# Patient Record
Sex: Female | Born: 1960
Health system: Southern US, Community
[De-identification: ages and names within clinical notes are randomized; demographics above are authoritative.]

## PROBLEM LIST (undated history)

## (undated) DIAGNOSIS — H579 Unspecified disorder of eye and adnexa: Secondary | ICD-10-CM

## (undated) DIAGNOSIS — I10 Essential (primary) hypertension: Secondary | ICD-10-CM

## (undated) DIAGNOSIS — G4730 Sleep apnea, unspecified: Secondary | ICD-10-CM

## (undated) DIAGNOSIS — M810 Age-related osteoporosis without current pathological fracture: Secondary | ICD-10-CM

## (undated) DIAGNOSIS — E785 Hyperlipidemia, unspecified: Secondary | ICD-10-CM

## (undated) DIAGNOSIS — T7840XA Allergy, unspecified, initial encounter: Secondary | ICD-10-CM

## (undated) HISTORY — DX: Unspecified disorder of eye and adnexa: H57.9

## (undated) HISTORY — PX: AUGMENTATION MAMMAPLASTY: SUR837

## (undated) HISTORY — DX: Essential (primary) hypertension: I10

## (undated) HISTORY — DX: Sleep apnea, unspecified: G47.30

## (undated) HISTORY — DX: Allergy, unspecified, initial encounter: T78.40XA

## (undated) HISTORY — PX: TUBAL LIGATION: SHX77

## (undated) HISTORY — PX: BREAST SURGERY: SHX581

## (undated) HISTORY — DX: Age-related osteoporosis without current pathological fracture: M81.0

## (undated) HISTORY — PX: COLONOSCOPY: SHX174

## (undated) HISTORY — DX: Hyperlipidemia, unspecified: E78.5

---

## 1997-12-19 ENCOUNTER — Ambulatory Visit (HOSPITAL_COMMUNITY): Admission: RE | Admit: 1997-12-19 | Discharge: 1997-12-19 | Payer: Self-pay | Admitting: Endocrinology

## 2012-06-11 ENCOUNTER — Encounter: Payer: Self-pay | Admitting: Family Medicine

## 2012-06-25 ENCOUNTER — Ambulatory Visit (INDEPENDENT_AMBULATORY_CARE_PROVIDER_SITE_OTHER): Payer: BC Managed Care – PPO | Admitting: Family Medicine

## 2012-06-25 ENCOUNTER — Encounter: Payer: Self-pay | Admitting: Family Medicine

## 2012-06-25 VITALS — BP 123/85 | HR 79 | Temp 98.4°F | Resp 16 | Ht 62.0 in | Wt 139.0 lb

## 2012-06-25 DIAGNOSIS — B9689 Other specified bacterial agents as the cause of diseases classified elsewhere: Secondary | ICD-10-CM

## 2012-06-25 DIAGNOSIS — B373 Candidiasis of vulva and vagina: Secondary | ICD-10-CM

## 2012-06-25 DIAGNOSIS — E785 Hyperlipidemia, unspecified: Secondary | ICD-10-CM

## 2012-06-25 DIAGNOSIS — Z0000 Encounter for general adult medical examination without abnormal findings: Secondary | ICD-10-CM

## 2012-06-25 DIAGNOSIS — Z1231 Encounter for screening mammogram for malignant neoplasm of breast: Secondary | ICD-10-CM

## 2012-06-25 DIAGNOSIS — Z23 Encounter for immunization: Secondary | ICD-10-CM

## 2012-06-25 LAB — POCT WET PREP WITH KOH
KOH Prep POC: POSITIVE
Trichomonas, UA: NEGATIVE
Yeast Wet Prep HPF POC: NEGATIVE

## 2012-06-25 LAB — LIPID PANEL
Cholesterol: 327 mg/dL — ABNORMAL HIGH (ref 0–200)
HDL: 44 mg/dL (ref >39)
Total CHOL/HDL Ratio: 7.4 Ratio
VLDL: 34 mg/dL (ref 0–40)

## 2012-06-25 MED ORDER — ACETAMINOPHEN 325 MG PO TABS: 650.0000 mg | ORAL_TABLET | Freq: Four times a day (QID) | ORAL | Status: DC | PRN

## 2012-06-25 MED ORDER — IBUPROFEN 200 MG PO TABS: 200.0000 mg | ORAL_TABLET | Freq: Four times a day (QID) | ORAL | Status: DC | PRN

## 2012-06-25 MED ORDER — METRONIDAZOLE 0.75 % VA GEL: 1.0000 | Freq: Two times a day (BID) | VAGINAL | Status: AC

## 2012-06-25 MED ORDER — FLUCONAZOLE 150 MG PO TABS: 150.0000 mg | ORAL_TABLET | Freq: Once | ORAL | Status: DC

## 2012-06-26 ENCOUNTER — Other Ambulatory Visit: Payer: Self-pay | Admitting: Family Medicine

## 2012-06-26 DIAGNOSIS — E785 Hyperlipidemia, unspecified: Secondary | ICD-10-CM | POA: Insufficient documentation

## 2012-06-26 MED ORDER — PRAVASTATIN SODIUM 40 MG PO TABS: 80.0000 mg | ORAL_TABLET | Freq: Every day | ORAL | Status: DC

## 2012-06-28 ENCOUNTER — Encounter: Payer: Self-pay | Admitting: Gastroenterology

## 2012-06-29 LAB — PAP IG, CT-NG, RFX HPV ASCU: GC Probe Amp: NEGATIVE

## 2012-08-11 ENCOUNTER — Encounter: Payer: Self-pay | Admitting: Gastroenterology

## 2012-08-13 ENCOUNTER — Encounter: Payer: Self-pay | Admitting: Gastroenterology

## 2012-08-20 ENCOUNTER — Ambulatory Visit: Payer: Self-pay

## 2012-10-29 ENCOUNTER — Ambulatory Visit: Payer: BC Managed Care – PPO | Admitting: Family Medicine

## 2012-11-12 ENCOUNTER — Ambulatory Visit: Payer: BC Managed Care – PPO | Admitting: Family Medicine

## 2012-11-12 NOTE — Progress Notes (Signed)
Subjective:    Patient ID: Sandra Ball, female    DOB: [birth year], 52 y.o.   MRN: [id number] Chief Complaint  Patient presents with  . Annual Exam  . Gynecologic Exam    HPI  Here for her routine exam today. Language barrier so daughter helps interpret.  No past medical history on file. Past Surgical History  Procedure Laterality Date  . Breast surgery    . Tubal ligation     No current outpatient prescriptions on file prior to visit.   No current facility-administered medications on file prior to visit.   Allergies  Allergen Reactions  . Aleve (Naproxen Sodium)    Family History  Problem Relation Age of Onset  . Hypertension Father   . Hypertension Mother    History   Social History  . Marital Status: Single    Spouse Name: N/A    Number of Children: N/A  . Years of Education: N/A   Social History Main Topics  . Smoking status: Never Smoker   . Smokeless tobacco: Not on file  . Alcohol Use: Not on file  . Drug Use: Not on file  . Sexually Active: Not on file   Other Topics Concern  . Not on file   Social History Narrative  . No narrative on file       Review of Systems  All other systems reviewed and are negative.      BP 123/85  Pulse 79  Temp(Src) 98.4 F (36.9 C)  Resp 16  Ht 5\' 2"  (1.575 m)  Wt 139 lb (63.05 kg)  BMI 25.42 kg/m2 Objective:   Physical Exam  Constitutional: She is oriented to person, place, and time. She appears well-developed and well-nourished. No distress.  HENT:  Head: Normocephalic and atraumatic.  Right Ear: Tympanic membrane, external ear and ear canal normal.  Left Ear: Tympanic membrane, external ear and ear canal normal.  Nose: No mucosal edema or rhinorrhea.  Mouth/Throat: Uvula is midline, oropharynx is clear and moist and mucous membranes are normal. No posterior oropharyngeal erythema.  Eyes: Conjunctivae and EOM are normal. Pupils are equal, round, and reactive to light. Right eye exhibits no discharge. Left  eye exhibits no discharge. No scleral icterus.  Neck: Normal range of motion. Neck supple. No thyromegaly present.  Cardiovascular: Normal rate, regular rhythm, normal heart sounds and intact distal pulses.   Pulmonary/Chest: Effort normal and breath sounds normal. No respiratory distress.  Abdominal: Soft. Bowel sounds are normal. There is no tenderness.  Genitourinary: No breast swelling, tenderness, discharge or bleeding. Uterus is tender. Cervix exhibits no motion tenderness and no friability. Right adnexum displays no mass and no tenderness. Left adnexum displays no mass and no tenderness. Vaginal discharge found.  Musculoskeletal: She exhibits no edema.  Lymphadenopathy:    She has no cervical adenopathy.  Neurological: She is alert and oriented to person, place, and time. She has normal reflexes.  Skin: Skin is warm and dry. She is not diaphoretic. No erythema.  Psychiatric: She has a normal mood and affect. Her behavior is normal.          Results for orders placed in visit on 06/25/12  LIPID PANEL      Result Value Range   Cholesterol 327 (*) 0 - 200 mg/dL   Triglycerides 409 (*) <150 mg/dL   HDL 44  >81 mg/dL   Total CHOL/HDL Ratio 7.4     VLDL 34  0 - 40 mg/dL   LDL Cholesterol 191 (*)  0 - 99 mg/dL  POCT WET PREP WITH KOH      Result Value Range   Trichomonas, UA Negative     Clue Cells Wet Prep HPF POC 5-12     Epithelial Wet Prep HPF POC 8-tntc     Yeast Wet Prep HPF POC neg     Bacteria Wet Prep HPF POC 1+     RBC Wet Prep HPF POC 0-3     WBC Wet Prep HPF POC 2-24     KOH Prep POC Positive    PAP IG, CT-NG, RFX HPV ASCU      Result Value Range   Specimen adequacy:       FINAL DIAGNOSIS:       Cytotechnologist:       Chlamydia Probe Amp NEGATIVE     GC Probe Amp NEGATIVE      Assessment & Plan:  Need for influenza vaccination - Plan: Flu vaccine greater than or equal to 3yo preservative free IM  Well adult exam - Plan: Ambulatory referral to  Gastroenterology, Pap IG, CT/NG w/ reflex HPV when ASC-U, POCT Wet Prep with KOH  Hyperlipidemia - Plan: Lipid Panel  Bacterial vaginitis - Plan: metroNIDAZOLE (METROGEL VAGINAL) 0.75 % vaginal gel  Yeast vaginitis - Plan: fluconazole (DIFLUCAN) 150 MG tablet  Other screening mammogram - Plan: MM Digital Screening  Meds ordered this encounter  Medications  . fish oil-omega-3 fatty acids 1000 MG capsule    Sig: Take 2 g by mouth daily.  . cyanocobalamin 100 MCG tablet    Sig: Take 100 mcg by mouth daily.  . Multiple Vitamin (MULTIVITAMIN) tablet    Sig: Take 1 tablet by mouth daily.  Marland Kitchen acetaminophen (TYLENOL) 325 MG tablet    Sig: Take 2 tablets (650 mg total) by mouth every 6 (six) hours as needed for pain.    Dispense:  120 tablet    Refill:  11  . ibuprofen (ADVIL,MOTRIN) 200 MG tablet    Sig: Take 1 tablet (200 mg total) by mouth every 6 (six) hours as needed for pain.    Dispense:  120 tablet    Refill:  11  . metroNIDAZOLE (METROGEL VAGINAL) 0.75 % vaginal gel    Sig: Place 1 Applicatorful vaginally 2 (two) times daily.    Dispense:  70 g    Refill:  0  . fluconazole (DIFLUCAN) 150 MG tablet    Sig: Take 1 tablet (150 mg total) by mouth once. Repeat if needed    Dispense:  2 tablet    Refill:  0

## 2012-12-10 ENCOUNTER — Ambulatory Visit: Payer: BC Managed Care – PPO | Admitting: Family Medicine

## 2013-06-17 ENCOUNTER — Other Ambulatory Visit (HOSPITAL_COMMUNITY)
Admission: RE | Admit: 2013-06-17 | Discharge: 2013-06-17 | Disposition: A | Discharge status: Home / Self Care | Payer: BC Managed Care – PPO | Source: Ambulatory Visit | Attending: Family Medicine | Admitting: Family Medicine

## 2013-06-17 ENCOUNTER — Other Ambulatory Visit: Payer: Self-pay | Admitting: Family Medicine

## 2013-06-17 DIAGNOSIS — Z01419 Encounter for gynecological examination (general) (routine) without abnormal findings: Secondary | ICD-10-CM | POA: Insufficient documentation

## 2013-10-30 ENCOUNTER — Encounter: Payer: Self-pay | Admitting: Family Medicine

## 2014-07-07 ENCOUNTER — Ambulatory Visit (INDEPENDENT_AMBULATORY_CARE_PROVIDER_SITE_OTHER): Payer: BC Managed Care – PPO | Admitting: Family Medicine

## 2014-07-07 VITALS — BP 128/84 | HR 71 | Temp 98.5°F | Resp 16 | Ht 62.5 in | Wt 137.0 lb

## 2014-07-07 DIAGNOSIS — Z23 Encounter for immunization: Secondary | ICD-10-CM

## 2014-07-07 DIAGNOSIS — Z0000 Encounter for general adult medical examination without abnormal findings: Secondary | ICD-10-CM

## 2014-07-07 NOTE — Patient Instructions (Signed)
Tdap Vaccine (Tetanus, Diphtheria, Pertussis): What You Need to Know 1. Why get vaccinated? Tetanus, diphtheria and pertussis can be very serious diseases, even for adolescents and adults. Tdap vaccine can protect us from these diseases. TETANUS (Lockjaw) causes painful muscle tightening and stiffness, usually all over the body.  It can lead to tightening of muscles in the head and neck so you can't open your mouth, swallow, or sometimes even breathe. Tetanus kills about 1 out of 5 people who are infected. DIPHTHERIA can cause a thick coating to form in the back of the throat.  It can lead to breathing problems, paralysis, heart failure, and death. PERTUSSIS (Whooping Cough) causes severe coughing spells, which can cause difficulty breathing, vomiting and disturbed sleep.  It can also lead to weight loss, incontinence, and rib fractures. Up to 2 in 100 adolescents and 5 in 100 adults with pertussis are hospitalized or have complications, which could include pneumonia or death. These diseases are caused by bacteria. Diphtheria and pertussis are spread from person to person through coughing or sneezing. Tetanus enters the body through cuts, scratches, or wounds. Before vaccines, the [place] saw as many as 200,000 cases a year of diphtheria and pertussis, and hundreds of cases of tetanus. Since vaccination began, tetanus and diphtheria have dropped by about 99% and pertussis by about 80%. 2. Tdap vaccine Tdap vaccine can protect adolescents and adults from tetanus, diphtheria, and pertussis. One dose of Tdap is routinely given at age 11 or 12. People who did not get Tdap at that age should get it as soon as possible. Tdap is especially important for health care professionals and anyone having close contact with a baby younger than 12 months. Pregnant women should get a dose of Tdap during every pregnancy, to protect the newborn from pertussis. Infants are most at risk for severe, life-threatening  complications from pertussis. A similar vaccine, called Td, protects from tetanus and diphtheria, but not pertussis. A Td booster should be given every 10 years. Tdap may be given as one of these boosters if you have not already gotten a dose. Tdap may also be given after a severe cut or burn to prevent tetanus infection. Your doctor can give you more information. Tdap may safely be given at the same time as other vaccines. 3. Some people should not get this vaccine  If you ever had a life-threatening allergic reaction after a dose of any tetanus, diphtheria, or pertussis containing vaccine, OR if you have a severe allergy to any part of this vaccine, you should not get Tdap. Tell your doctor if you have any severe allergies.  If you had a coma, or long or multiple seizures within 7 days after a childhood dose of DTP or DTaP, you should not get Tdap, unless a cause other than the vaccine was found. You can still get Td.  Talk to your doctor if you:  have epilepsy or another nervous system problem,  had severe pain or swelling after any vaccine containing diphtheria, tetanus or pertussis,  ever had Guillain-Barr Syndrome (GBS),  aren't feeling well on the day the shot is scheduled. 4. Risks of a vaccine reaction With any medicine, including vaccines, there is a chance of side effects. These are usually mild and go away on their own, but serious reactions are also possible. Brief fainting spells can follow a vaccination, leading to injuries from falling. Sitting or lying down for about 15 minutes can help prevent these. Tell your doctor if you feel   dizzy or light-headed, or have vision changes or ringing in the ears. Mild problems following Tdap (Did not interfere with activities)  Pain where the shot was given (about 3 in 4 adolescents or 2 in 3 adults)  Redness or swelling where the shot was given (about 1 person in 5)  Mild fever of at least 100.22F (up to about 1 in 25 adolescents or  1 in 100 adults)  Headache (about 3 or 4 people in 10)  Tiredness (about 1 person in 3 or 4)  Nausea, vomiting, diarrhea, stomach ache (up to 1 in 4 adolescents or 1 in 10 adults)  Chills, body aches, sore joints, rash, swollen glands (uncommon) Moderate problems following Tdap (Interfered with activities, but did not require medical attention)  Pain where the shot was given (about 1 in 5 adolescents or 1 in 100 adults)  Redness or swelling where the shot was given (up to about 1 in 16 adolescents or 1 in 25 adults)  Fever over 102F (about 1 in 100 adolescents or 1 in 250 adults)  Headache (about 3 in 20 adolescents or 1 in 10 adults)  Nausea, vomiting, diarrhea, stomach ache (up to 1 or 3 people in 100)  Swelling of the entire arm where the shot was given (up to about 3 in 100). Severe problems following Tdap (Unable to perform usual activities; required medical attention)  Swelling, severe pain, bleeding and redness in the arm where the shot was given (rare). A severe allergic reaction could occur after any vaccine (estimated less than 1 in a million doses). 5. What if there is a serious reaction? What should I look for?  Look for anything that concerns you, such as signs of a severe allergic reaction, very high fever, or behavior changes. Signs of a severe allergic reaction can include hives, swelling of the face and throat, difficulty breathing, a fast heartbeat, dizziness, and weakness. These would start a few minutes to a few hours after the vaccination. What should I do?  If you think it is a severe allergic reaction or other emergency that can't wait, call 9-1-1 or get the person to the nearest hospital. Otherwise, call your doctor.  Afterward, the reaction should be reported to the "Vaccine Adverse Event Reporting System" (VAERS). Your doctor might file this report, or you can do it yourself through the VAERS web site at www.vaers.SamedayNews.es, or by calling  2026395868. VAERS is only for reporting reactions. They do not give medical advice.  6. The National Vaccine Injury Compensation Program The [employer] Vaccine Injury Compensation Program (VICP) is a federal program that was created to compensate people who may have been injured by certain vaccines. Persons who believe they may have been injured by a vaccine can learn about the program and about filing a claim by calling (605)783-3257 or visiting the [place] website at GoldCloset.com.ee. 7. How can I learn more?  Ask your doctor.  Call your local or state health department.  Contact the Centers for Disease Control and Prevention (CDC):  Call 332 251 5074 or visit CDC's website at http://hunter.com/. CDC Tdap Vaccine VIS (01/15/12) Document Released: 02/24/2012 Document Revised: 01/09/2014 Document Reviewed: 12/07/2013 ExitCare Patient Information 2015 [place], [place]. This information is not intended to replace advice given to you by your health care provider. Make sure you discuss any questions you have with your health care provider. Influenza Virus Vaccine (Flucelvax) What is this medicine? INFLUENZA VIRUS VACCINE (in floo EN zuh VAHY ruhs vak SEEN) helps to reduce the risk of  getting influenza also known as the flu. The vaccine only helps protect you against some strains of the flu. This medicine may be used for other purposes; ask your health care provider or pharmacist if you have questions. COMMON BRAND NAME(S): FLUCELVAX What should I tell my health care provider before I take this medicine? They need to know if you have any of these conditions: -bleeding disorder like hemophilia -fever or infection -Guillain-Barre syndrome or other neurological problems -immune system problems -infection with the human immunodeficiency virus (HIV) or AIDS -low blood platelet counts -multiple sclerosis -an unusual or allergic reaction to influenza virus vaccine, other  medicines, foods, dyes or preservatives -pregnant or trying to get pregnant -breast-feeding How should I use this medicine? This vaccine is for injection into a muscle. It is given by a health care professional. A copy of Vaccine Information Statements will be given before each vaccination. Read this sheet carefully each time. The sheet may change frequently. Talk to your pediatrician regarding the use of this medicine in children. Special care may be needed. Overdosage: If you think you've taken too much of this medicine contact a poison control center or emergency room at once. Overdosage: If you think you have taken too much of this medicine contact a poison control center or emergency room at once. NOTE: This medicine is only for you. Do not share this medicine with others. What if I miss a dose? This does not apply. What may interact with this medicine? -chemotherapy or radiation therapy -medicines that lower your immune system like etanercept, anakinra, infliximab, and adalimumab -medicines that treat or prevent blood clots like warfarin -phenytoin -steroid medicines like prednisone or cortisone -theophylline -vaccines This list may not describe all possible interactions. Give your health care provider a list of all the medicines, herbs, non-prescription drugs, or dietary supplements you use. Also tell them if you smoke, drink alcohol, or use illegal drugs. Some items may interact with your medicine. What should I watch for while using this medicine? Report any side effects that do not go away within 3 days to your doctor or health care professional. Call your health care provider if any unusual symptoms occur within 6 weeks of receiving this vaccine. You may still catch the flu, but the illness is not usually as bad. You cannot get the flu from the vaccine. The vaccine will not protect against colds or other illnesses that may cause fever. The vaccine is needed every year. What side  effects may I notice from receiving this medicine? Side effects that you should report to your doctor or health care professional as soon as possible: -allergic reactions like skin rash, itching or hives, swelling of the face, lips, or tongue Side effects that usually do not require medical attention (Report these to your doctor or health care professional if they continue or are bothersome.): -fever -headache -muscle aches and pains -pain, tenderness, redness, or swelling at the injection site -tiredness This list may not describe all possible side effects. Call your doctor for medical advice about side effects. You may report side effects to FDA at 1-800-FDA-1088. Where should I keep my medicine? The vaccine will be given by a health care professional in a clinic, pharmacy, doctor's office, or other health care setting. You will not be given vaccine doses to store at home. NOTE: This sheet is a summary. It may not cover all possible information. If you have questions about this medicine, talk to your doctor, pharmacist, or health care provider.  2015, Elsevier/Gold Standard. (2011-08-06 14:06:47)

## 2014-07-07 NOTE — Progress Notes (Signed)
Subjective:    Patient ID: Sandra Ball, female    DOB: May 15, 1961, 53 y.o.   MRN: [id number]  HPI This is a 53 yo female who presents today for CPE. Her daughter, Vallarie Mare, who works at [employer], is accompanying her this morning. She sees a provider at [facility] for her primary care. She needs verification of  CPE for her insurance.  Colonoscopy- 2015, normal, follow up in 10 years PAP- 06/30/13- normal Mammo- 08/05/13 Flu- today Tdap- today Cholesterol checked 12/16/13 with big improvement from previous value Dental- has regular care Eye- has regular care Exercise- no regular  She has a follow up appointment with her PCP 1/16 for blood work.   [place] last week and landed on her left hand, has had wrist pain but this is getting better. It did not swell or become discolored. 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 The pain has decreased daily. She has not taken any medication for pain.   Past Medical History  Diagnosis Date  . 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 Reino Bellis   Past Surgical History  Procedure Laterality Date  . Breast surgery    . Tubal ligation     Family History  Problem Relation Age of Onset  . Hypertension Father   . Hypertension Mother   . Cancer Mother     lung   History  Substance Use Topics  . Smoking status: Never Smoker   . Smokeless tobacco: Not on file  . Alcohol Use: No   Review of Systems  Musculoskeletal: Positive for arthralgias (left wrist).  All other systems reviewed and are negative.      Objective:   Physical Exam  Constitutional: She is oriented to person, place, and time. She appears well-developed and well-nourished.  HENT:  Head: Normocephalic and atraumatic.  Right Ear: Tympanic membrane, external ear and ear canal normal.  Left Ear: Tympanic membrane, external ear and ear canal normal.  Nose: Nose normal.  Mouth/Throat: Uvula is midline, oropharynx is clear and moist and mucous membranes are  normal.  Eyes: Conjunctivae and EOM are normal. Pupils are equal, round, and reactive to light.  Neck: Normal range of motion. Neck supple. No JVD present. No thyromegaly present.  Cardiovascular: Normal rate, regular rhythm, normal heart sounds and intact distal pulses.   Pulmonary/Chest: Effort normal and breath sounds normal.  Abdominal: Soft. Bowel sounds are normal. She exhibits no distension and no mass. There is no tenderness. There is no rebound and no guarding.  Musculoskeletal: Normal range of motion.       Left wrist: She exhibits normal range of motion, no tenderness, no bony tenderness, no swelling, no effusion and no deformity.  Lymphadenopathy:    She has no cervical adenopathy.  Neurological: She is alert and oriented to person, place, and time. She has normal reflexes.  Skin: Skin is warm and dry.  Psychiatric: She has a normal mood and affect. Her behavior is normal. Judgment and thought content normal.  Vitals reviewed.     Assessment & Plan:  1. Routine general medical examination at a health care facility - Patient receives gyn care and lab follow up from PCP. She has an appointment 1/16 for follow up. Will defer labs. -Follow up PRN.  2. Flu vaccine need   3. Need for Tdap vaccination  4. Left wrist pain -Her exam is normal and the pain is subsiding -Follow up PRN  Dempsy Damiano B.  Carlean Purl, FNP-BC  [facility]  07/10/2014 8:04 PM

## 2015-04-20 ENCOUNTER — Ambulatory Visit (INDEPENDENT_AMBULATORY_CARE_PROVIDER_SITE_OTHER): Payer: BLUE CROSS/BLUE SHIELD | Admitting: Primary Care

## 2015-04-20 ENCOUNTER — Encounter (INDEPENDENT_AMBULATORY_CARE_PROVIDER_SITE_OTHER): Payer: Self-pay

## 2015-04-20 ENCOUNTER — Encounter: Payer: Self-pay | Admitting: Primary Care

## 2015-04-20 VITALS — BP 136/94 | HR 67 | Temp 98.0°F | Ht 62.5 in | Wt 138.0 lb

## 2015-04-20 DIAGNOSIS — R030 Elevated blood-pressure reading, without diagnosis of hypertension: Secondary | ICD-10-CM

## 2015-04-20 DIAGNOSIS — E785 Hyperlipidemia, unspecified: Secondary | ICD-10-CM | POA: Diagnosis not present

## 2015-04-20 DIAGNOSIS — I10 Essential (primary) hypertension: Secondary | ICD-10-CM | POA: Insufficient documentation

## 2015-04-20 MED ORDER — ATORVASTATIN CALCIUM 40 MG PO TABS: 40.0000 mg | ORAL_TABLET | Freq: Every day | ORAL | Status: DC

## 2015-04-20 NOTE — Assessment & Plan Note (Signed)
Elevated today. She endorses readings of 140's/90's at home when checking BP. She does not want to take medication. We discussed long term effects of elevated BP. She will work on improving her diet and start exercising. We will recheck her BP at her upcoming physical in October. If elevated at next appointment, will add medication.

## 2015-04-20 NOTE — Patient Instructions (Signed)
Your blood pressure is elevated today. Your blood pressure goal is to be less than 140/90. You can decrease your blood pressure by limiting salty foods, eating more fresh fruits and vegetables, and exercising at least 45 minutes 5 days weekly.  Please schedule a physical with me in the next 2-3 months. You will also schedule a lab only appointment one week prior. We will discuss your lab results during your physical.  It was a pleasure to meet you today! Please don't hesitate to call me with any questions. Welcome to [facility]!  Low-Sodium Eating Plan Sodium raises blood pressure and causes water to be held in the body. Getting less sodium from food will help lower your blood pressure, reduce any swelling, and protect your heart, liver, and kidneys. We get sodium by adding salt (sodium chloride) to food. Most of our sodium comes from canned, boxed, and frozen foods. Restaurant foods, fast foods, and pizza are also very high in sodium. Even if you take medicine to lower your blood pressure or to reduce fluid in your body, getting less sodium from your food is important. WHAT IS MY PLAN? Most people should limit their sodium intake to 2,300 mg a day. Your health care provider recommends that you limit your sodium intake to __________ a day.  WHAT DO I NEED TO KNOW ABOUT THIS EATING PLAN? For the low-sodium eating plan, you will follow these general guidelines:  Choose foods with a % Daily Value for sodium of less than 5% (as listed on the food label).   Use salt-free seasonings or herbs instead of table salt or sea salt.   Check with your health care provider or pharmacist before using salt substitutes.   Eat fresh foods.  Eat more vegetables and fruits.  Limit canned vegetables. If you do use them, rinse them well to decrease the sodium.   Limit cheese to 1 oz (28 g) per day.   Eat lower-sodium products, often labeled as "lower sodium" or "no salt added."  Avoid foods that contain  monosodium glutamate (MSG). MSG is sometimes added to [place] food and some canned foods.  Check food labels (Nutrition Facts labels) on foods to learn how much sodium is in one serving.  Eat more home-cooked food and less restaurant, buffet, and fast food.  When eating at a restaurant, ask that your food be prepared with less salt or none, if possible.  HOW DO I READ FOOD LABELS FOR SODIUM INFORMATION? The Nutrition Facts label lists the amount of sodium in one serving of the food. If you eat more than one serving, you must multiply the listed amount of sodium by the number of servings. Food labels may also identify foods as:  Sodium free--Less than 5 mg in a serving.  Very low sodium--35 mg or less in a serving.  Low sodium--140 mg or less in a serving.  Light in sodium--50% less sodium in a serving. For example, if a food that usually has 300 mg of sodium is changed to become light in sodium, it will have 150 mg of sodium.  Reduced sodium--25% less sodium in a serving. For example, if a food that usually has 400 mg of sodium is changed to reduced sodium, it will have 300 mg of sodium. WHAT FOODS CAN I EAT? Grains Low-sodium cereals, including oats, puffed wheat and rice, and shredded wheat cereals. Low-sodium crackers. Unsalted rice and pasta. Lower-sodium bread.  Vegetables Frozen or fresh vegetables. Low-sodium or reduced-sodium canned vegetables. Low-sodium or reduced-sodium tomato  sauce and paste. Low-sodium or reduced-sodium tomato and vegetable juices.  Fruits Fresh, frozen, and canned fruit. Fruit juice.  Meat and Other Protein Products Low-sodium canned tuna and salmon. Fresh or frozen meat, poultry, seafood, and fish. Lamb. Unsalted nuts. Dried beans, peas, and lentils without added salt. Unsalted canned beans. Homemade soups without salt. Eggs.  Dairy Milk. Soy milk. Ricotta cheese. Low-sodium or reduced-sodium cheeses. Yogurt.  Condiments Fresh and dried herbs  and spices. Salt-free seasonings. Onion and garlic powders. Low-sodium varieties of mustard and ketchup. Lemon juice.  Fats and Oils Reduced-sodium salad dressings. Unsalted butter.  Other Unsalted popcorn and pretzels.  The items listed above may not be a complete list of recommended foods or beverages. Contact your dietitian for more options. WHAT FOODS ARE NOT RECOMMENDED? Grains Instant hot cereals. Bread stuffing, pancake, and biscuit mixes. Croutons. Seasoned rice or pasta mixes. Noodle soup cups. Boxed or frozen macaroni and cheese. Self-rising flour. Regular salted crackers. Vegetables Regular canned vegetables. Regular canned tomato sauce and paste. Regular tomato and vegetable juices. Frozen vegetables in sauces. Salted french fries. Olives. Angie Fava. Relishes. Sauerkraut. Salsa. Meat and Other Protein Products Salted, canned, smoked, spiced, or pickled meats, seafood, or fish. Bacon, ham, sausage, hot dogs, corned beef, chipped beef, and packaged luncheon meats. Salt pork. Jerky. Pickled herring. Anchovies, regular canned tuna, and sardines. Salted nuts. Dairy Processed cheese and cheese spreads. Cheese curds. Blue cheese and cottage cheese. Buttermilk.  Condiments Onion and garlic salt, seasoned salt, table salt, and sea salt. Canned and packaged gravies. Worcestershire sauce. Tartar sauce. Barbecue sauce. Teriyaki sauce. Soy sauce, including reduced sodium. Steak sauce. Fish sauce. Oyster sauce. Cocktail sauce. Horseradish. Regular ketchup and mustard. Meat flavorings and tenderizers. Bouillon cubes. Hot sauce. Tabasco sauce. Marinades. Taco seasonings. Relishes. Fats and Oils Regular salad dressings. Salted butter. Margarine. Ghee. Bacon fat.  Other Potato and tortilla chips. Corn chips and puffs. Salted popcorn and pretzels. Canned or dried soups. Pizza. Frozen entrees and pot pies.  The items listed above may not be a complete list of foods and beverages to avoid.  Contact your dietitian for more information. Document Released: 02/14/2002 Document Revised: 08/30/2013 Document Reviewed: 06/29/2013 [facility] Patient Information 2015 [place], [place]. This information is not intended to replace advice given to you by your health care provider. Make sure you discuss any questions you have with your health care provider.

## 2015-04-20 NOTE — Progress Notes (Signed)
Subjective:    Patient ID: Sandra Ball, female    DOB: 01/09/1961, 54 y.o.   MRN: [id number]  HPI  Ms. Mom is a 54 year old female who presents today to establish care and discuss the problems mentioned below. Will obtain old records.  1) Hyperlipidemia: Last lipid panel was checked on 09/2014. Total cholesterol at 221 and LDL at 144 which is a significant decrease in comparrison to her lipid panel in 2013. She is currently managed on lipitor 40 mg which was increased in January 2016 from 20 mg to 40 mg. Denies complications on lipitor such as myalgias.  2) Elevated Blood Pressure: Denies prior elevated BP readings in the previous clinics. She will occasionally check her BP at home and will get 140/90's. Last BP check was 2 weeks ago. She has occasional headaches. Denies chest pain, shortness of breath.  Endorses a healthy diet. Denies consumption of extra salt or salty foods. She is not currently exercising but is active during the day at work.  Review of Systems  Constitutional: Negative for unexpected weight change.  HENT: Negative for rhinorrhea.   Respiratory: Negative for cough and shortness of breath.   Cardiovascular: Negative for chest pain.  Gastrointestinal: Negative for diarrhea and constipation.  Genitourinary: Negative for difficulty urinating.       Postmenopausal  Musculoskeletal: Negative for myalgias and arthralgias.       Occasional knee pain  Skin: Negative for rash.  Neurological: Negative for dizziness and numbness.       Occasional headaches  Psychiatric/Behavioral:       Denies concerns for anxiety or depression       Past Medical History  Diagnosis Date  . 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 Reino Bellis  . Hyperlipidemia   . Hypertension     Social History   Social History  . Marital Status: Married    Spouse Name: N/A  . Number of Children: 3  . Years of Education: N/A   Occupational History  . Not on file.   Social History Main Topics  . Smoking status:  Never Smoker   . Smokeless tobacco: Not on file  . Alcohol Use: No  . Drug Use: No  . Sexual Activity: Yes    Birth Control/ Protection: None     Comment: divorced; 1 partner in past yr   Other Topics Concern  . Not on file   Social History Narrative   Married.   Works to [employer].   3 children.   Enjoys being outdoors in the garden.     Past Surgical History  Procedure Laterality Date  . Breast surgery    . Tubal ligation      Family History  Problem Relation Age of Onset  . Hypertension Father   . Hypertension Mother   . Cancer Mother     lung    Allergies  Allergen Reactions  . Aleve [Naproxen Sodium]     No current outpatient prescriptions on file prior to visit.   No current facility-administered medications on file prior to visit.    BP 136/94 mmHg  Pulse 67  Temp(Src) 98 F (36.7 C) (Oral)  Ht 5' 2.5" (1.588 m)  Wt 138 lb (62.596 kg)  BMI 24.82 kg/m2  SpO2 97%     Objective:   Physical Exam  Constitutional: She appears well-nourished.  Cardiovascular: Normal rate and regular rhythm.   Pulmonary/Chest: Effort normal and breath sounds normal.  Skin: Skin is warm and dry.  Psychiatric: She has a normal mood and affect.          Assessment & Plan:

## 2015-04-20 NOTE — Assessment & Plan Note (Signed)
Currently managed on Lipitor 40 mg. Last lipid panel was in 09/2014 and improved. Will recheck lipids during physical in October. Discussed healthy diet and exercise

## 2015-04-20 NOTE — Progress Notes (Signed)
Pre visit review using our clinic review tool, if applicable. No additional management support is needed unless otherwise documented below in the visit note. 

## 2015-06-29 ENCOUNTER — Encounter: Payer: Self-pay | Admitting: Primary Care

## 2015-06-29 ENCOUNTER — Ambulatory Visit (INDEPENDENT_AMBULATORY_CARE_PROVIDER_SITE_OTHER): Payer: 59 | Admitting: Primary Care

## 2015-06-29 VITALS — BP 146/94 | HR 64 | Temp 97.9°F | Ht 63.0 in | Wt 136.4 lb

## 2015-06-29 DIAGNOSIS — I10 Essential (primary) hypertension: Secondary | ICD-10-CM

## 2015-06-29 DIAGNOSIS — E785 Hyperlipidemia, unspecified: Secondary | ICD-10-CM

## 2015-06-29 DIAGNOSIS — Z0000 Encounter for general adult medical examination without abnormal findings: Secondary | ICD-10-CM

## 2015-06-29 LAB — TSH: TSH: 1.86 u[IU]/mL (ref 0.35–4.50)

## 2015-06-29 LAB — COMPREHENSIVE METABOLIC PANEL
ALBUMIN: 4.2 g/dL (ref 3.5–5.2)
ALK PHOS: 81 U/L (ref 39–117)
ALT: 17 U/L (ref 0–35)
AST: 22 U/L (ref 0–37)
BUN: 11 mg/dL (ref 6–23)
CHLORIDE: 107 meq/L (ref 96–112)
CO2: 27 mEq/L (ref 19–32)
Calcium: 10 mg/dL (ref 8.4–10.5)
Creatinine, Ser: 0.63 mg/dL (ref 0.40–1.20)
GFR: 104.42 mL/min (ref >60.00)
GLUCOSE: 89 mg/dL (ref 70–99)
POTASSIUM: 4.1 meq/L (ref 3.5–5.1)
SODIUM: 141 meq/L (ref 135–145)
TOTAL PROTEIN: 7.5 g/dL (ref 6.0–8.3)
Total Bilirubin: 0.5 mg/dL (ref 0.2–1.2)

## 2015-06-29 LAB — VITAMIN D 25 HYDROXY (VIT D DEFICIENCY, FRACTURES): VITD: 20.07 ng/mL — ABNORMAL LOW (ref 30.00–100.00)

## 2015-06-29 LAB — LIPID PANEL
CHOLESTEROL: 252 mg/dL — AB (ref 0–200)
HDL: 46.4 mg/dL (ref >39.00)
LDL Cholesterol: 180 mg/dL — ABNORMAL HIGH (ref 0–99)
NonHDL: 205.76
TRIGLYCERIDES: 131 mg/dL (ref 0.0–149.0)
Total CHOL/HDL Ratio: 5
VLDL: 26.2 mg/dL (ref 0.0–40.0)

## 2015-06-29 LAB — HEMOGLOBIN A1C: Hgb A1c MFr Bld: 5.9 % (ref 4.6–6.5)

## 2015-06-29 MED ORDER — HYDROCHLOROTHIAZIDE 25 MG PO TABS: 25.0000 mg | ORAL_TABLET | Freq: Every day | ORAL | Status: DC

## 2015-06-29 NOTE — Assessment & Plan Note (Signed)
Has not checked her BP since last visit. BP elevated again during todays visit. Will initiate HCTZ 25 mg tablets for hypertension. CMP pending today. Follow up in 2 weeks.

## 2015-06-29 NOTE — Patient Instructions (Signed)
Start Hydrochlorothiazide tablets for blood pressure. Take 1 tablet by mouth once daily.  Continue taking your atorvastatin 40 mg for cholesterol.  Complete lab work prior to leaving today. I will notify you of your results.  Follow up in 2 weeks for re-evaluation of blood pressure.  It was a pleasure to see you today!

## 2015-06-29 NOTE — Progress Notes (Signed)
Subjective:    Patient ID: Sandra Ball, female    DOB: [birth year], 54 y.o.   MRN: [id number]  HPI  Sandra Ball is a 54 year old female who presents today for complete physical.  Immunizations: -Tetanus: Completed in 2015 -Influenza: Completed in 2016 -Pneumonia: Completed Prevnar in 2016   Diet: She endorses a fair diet. Breakfast: Cereal Lunch: Chicken Dinner: [employer], some vegetables, soup Desserts: None Snack: None Beverages: Water Exercise: She is active at work but does not routinely exercise Eye exam: Completed in July 2016. Due again January 2017. Dental exam: Completed 1 year ago. Colonoscopy: Completed in 2015 Pap Smear: Completed in October 2014. Negative. Due again in 2017. Mammogram: Completed 1 year ago. Normal per patient.   Review of Systems  Constitutional: Negative for unexpected weight change.  HENT: Negative for rhinorrhea.   Respiratory: Negative for cough and shortness of breath.   Cardiovascular: Negative for chest pain.  Gastrointestinal: Negative for diarrhea and constipation.  Genitourinary: Negative for difficulty urinating.  Musculoskeletal: Negative for myalgias and arthralgias.  Skin: Negative for rash.  Neurological: Negative for dizziness, numbness and headaches.  Psychiatric/Behavioral:       Denies concerns for anxiety or depression       Past Medical History  Diagnosis Date  . 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 Reino Bellis  . Hyperlipidemia   . Hypertension     Social History   Social History  . Marital Status: Married    Spouse Name: N/A  . Number of Children: 3  . Years of Education: N/A   Occupational History  . Not on file.   Social History Main Topics  . Smoking status: Never Smoker   . Smokeless tobacco: Not on file  . Alcohol Use: No  . Drug Use: No  . Sexual Activity: Yes    Birth Control/ Protection: None     Comment: divorced; 1 partner in past yr   Other Topics Concern  . Not on file   Social History Narrative   Married.   Works to [employer].   3 children.   Enjoys being outdoors in the garden.     Past Surgical History  Procedure Laterality Date  . Breast surgery    . Tubal ligation      Family History  Problem Relation Age of Onset  . Hypertension Father   . Hypertension Mother   . Cancer Mother     lung    Allergies  Allergen Reactions  . Aleve [Naproxen Sodium]     Current Outpatient Prescriptions on File Prior to Visit  Medication Sig Dispense Refill  . atorvastatin (LIPITOR) 40 MG tablet Take 1 tablet (40 mg total) by mouth daily. (Patient not taking: Reported on 06/29/2015) 30 tablet 5   No current facility-administered medications on file prior to visit.    BP 146/94 mmHg  Pulse 64  Temp(Src) 97.9 F (36.6 C) (Oral)  Ht 5\' 3"  (1.6 m)  Wt 136 lb 6.4 oz (61.871 kg)  BMI 24.17 kg/m2  SpO2 99%    Objective:   Physical Exam  Constitutional: She is oriented to person, place, and time. She appears well-nourished.  HENT:  Right Ear: Tympanic membrane and ear canal normal.  Left Ear: Tympanic membrane and ear canal normal.  Nose: Nose normal.  Mouth/Throat: Oropharynx is clear and moist.  Eyes: Conjunctivae and EOM are normal. Pupils are equal, round, and reactive to light.  Neck: Neck supple. No thyromegaly present.  Cardiovascular: Normal  rate and regular rhythm.   Pulmonary/Chest: Effort normal and breath sounds normal.  Abdominal: Soft. Bowel sounds are normal.  Musculoskeletal: Normal range of motion.  Neurological: She is alert and oriented to person, place, and time. She has normal reflexes. No cranial nerve deficit. Coordination normal.  Skin: Skin is warm and dry.  Psychiatric: She has a normal mood and affect.          Assessment & Plan:

## 2015-06-29 NOTE — Assessment & Plan Note (Signed)
Tdap, flu, pap, mammogram, colonoscopy UTD.  Labs pending. Exam unremarkable. Discussed the importance of healthy diet and exercise. Follow up in 1 year for repeat physical.

## 2015-06-29 NOTE — Progress Notes (Signed)
Pre visit review using our clinic review tool, if applicable. No additional management support is needed unless otherwise documented below in the visit note. 

## 2015-06-29 NOTE — Assessment & Plan Note (Signed)
Managed on atorvastatin 40 mg, has not had in one week. Discussed the importance of taking this daily. Lipid panel pending. Discussed healthy diet and exercise.

## 2015-07-02 ENCOUNTER — Other Ambulatory Visit: Payer: Self-pay | Admitting: Primary Care

## 2015-07-02 ENCOUNTER — Encounter: Payer: Self-pay | Admitting: *Deleted

## 2015-07-02 DIAGNOSIS — E559 Vitamin D deficiency, unspecified: Secondary | ICD-10-CM

## 2015-07-02 MED ORDER — VITAMIN D (ERGOCALCIFEROL) 1.25 MG (50000 UNIT) PO CAPS: ORAL_CAPSULE | ORAL | Status: DC

## 2015-09-22 ENCOUNTER — Other Ambulatory Visit: Payer: Self-pay | Admitting: Primary Care

## 2015-09-24 NOTE — Telephone Encounter (Signed)
Electronically refill request for   2 months ago (06/29/2015) - matches original prescription received from pharmacy   hydrochlorothiazide (HYDRODIURIL) 25 MG tablet  Take 1 tablet (25 mg total) by mouth daily.  Dispense: 90 tablet   Refills: 0     Last prescribed on 06/29/2015. Last seen on 06/29/2015. No future appt.

## 2015-09-25 NOTE — Telephone Encounter (Signed)
Called and spoken to patient. Follow up on 10/19/2015.

## 2015-10-19 ENCOUNTER — Ambulatory Visit (INDEPENDENT_AMBULATORY_CARE_PROVIDER_SITE_OTHER): Payer: 59 | Admitting: Primary Care

## 2015-10-19 ENCOUNTER — Encounter: Payer: Self-pay | Admitting: Primary Care

## 2015-10-19 VITALS — BP 130/86 | HR 67 | Temp 97.7°F | Ht 63.0 in | Wt 130.0 lb

## 2015-10-19 DIAGNOSIS — E559 Vitamin D deficiency, unspecified: Secondary | ICD-10-CM

## 2015-10-19 DIAGNOSIS — I10 Essential (primary) hypertension: Secondary | ICD-10-CM | POA: Diagnosis not present

## 2015-10-19 DIAGNOSIS — R7303 Prediabetes: Secondary | ICD-10-CM

## 2015-10-19 DIAGNOSIS — E785 Hyperlipidemia, unspecified: Secondary | ICD-10-CM | POA: Diagnosis not present

## 2015-10-19 LAB — LIPID PANEL
Cholesterol: 187 mg/dL (ref 0–200)
HDL: 51.4 mg/dL (ref >39.00)
LDL Cholesterol: 116 mg/dL — ABNORMAL HIGH (ref 0–99)
NonHDL: 135.55
Total CHOL/HDL Ratio: 4
Triglycerides: 96 mg/dL (ref 0.0–149.0)
VLDL: 19.2 mg/dL (ref 0.0–40.0)

## 2015-10-19 LAB — BASIC METABOLIC PANEL
BUN: 14 mg/dL (ref 6–23)
CALCIUM: 10.4 mg/dL (ref 8.4–10.5)
CHLORIDE: 103 meq/L (ref 96–112)
CO2: 30 meq/L (ref 19–32)
CREATININE: 0.63 mg/dL (ref 0.40–1.20)
GFR: 104.3 mL/min (ref >60.00)
GLUCOSE: 93 mg/dL (ref 70–99)
Potassium: 3.7 mEq/L (ref 3.5–5.1)
Sodium: 140 mEq/L (ref 135–145)

## 2015-10-19 LAB — VITAMIN D 25 HYDROXY (VIT D DEFICIENCY, FRACTURES): VITD: 56.3 ng/mL (ref 30.00–100.00)

## 2015-10-19 LAB — HEMOGLOBIN A1C: Hgb A1c MFr Bld: 6.1 % (ref 4.6–6.5)

## 2015-10-19 NOTE — Progress Notes (Signed)
Pre visit review using our clinic review tool, if applicable. No additional management support is needed unless otherwise documented below in the visit note. 

## 2015-10-19 NOTE — Patient Instructions (Addendum)
Complete lab work prior to leaving today. I will notify you of your results once received.   Continue your efforts towards a healthy life style! Congratulations on your weight loss!  Follow up in 6 months for re-evaluation.  It was a pleasure to see you today!

## 2015-10-19 NOTE — Assessment & Plan Note (Signed)
Currently managed on Lipitor 40 mg. Due for lipid panel today. She's lost 6 pounds since her last visit by cutting back sugar and portion sizes. Commended her on this success!

## 2015-10-19 NOTE — Assessment & Plan Note (Signed)
A1C of 5.9 in October 2016. She's lost 6 pounds since last visit by cutting back sugars and portion sizes. A1C due today and is pending.

## 2015-10-19 NOTE — Progress Notes (Signed)
Subjective:    Patient ID: Sandra Ball, female    DOB: 1961/04/16, 55 y.o.   MRN: [id number]  HPI  Ms. Mom is a 55 year old female who presents today for follow up.  1) Hypertension: Currently managed on HCTZ 25 mg that was initiated in October 2016. She was to follow up in early November 2016 for re-evaluation and did not schedule follow up. Since her last visit her blood pressure is improved and is stable. Her daughter reports stable home readings about the same as in clinic today. Denies chest pain, dizziness, headaches.   2) Hyperlipidemia: Currently managed on atorvastatin 40 mg but not compliant with daily administration last visit. LDL of 180 and TC of 252 in October 2016. She endorses taking her Lipitor everyday since our last visit. She's been working to improve her diet by cutting back on her portion sizes and sugar consumption.  Wt Readings from Last 3 Encounters:  10/19/15 130 lb (58.968 kg)  06/29/15 136 lb 6.4 oz (61.871 kg)  04/20/15 138 lb (62.596 kg)     3) Vitamin D deficiency: Level of 20 in October 2016. She was managed on 50,000 units once weekly for 12 weeks. She's since completed her vitamin D and is due for redraw.   Review of Systems  Respiratory: Negative for shortness of breath.   Cardiovascular: Negative for chest pain.  Endocrine: Negative for polyuria.  Neurological: Negative for dizziness and headaches.       Past Medical History  Diagnosis Date  . 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 Reino Bellis  . Hyperlipidemia   . Hypertension     Social History   Social History  . Marital Status: Married    Spouse Name: N/A  . Number of Children: 3  . Years of Education: N/A   Occupational History  . Not on file.   Social History Main Topics  . Smoking status: Never Smoker   . Smokeless tobacco: Not on file  . Alcohol Use: No  . Drug Use: No  . Sexual Activity: Yes    Birth Control/ Protection: None     Comment: divorced; 1 partner in past yr   Other Topics  Concern  . Not on file   Social History Narrative   Married.   Works to [employer].   3 children.   Enjoys being outdoors in the garden.     Past Surgical History  Procedure Laterality Date  . Breast surgery    . Tubal ligation      Family History  Problem Relation Age of Onset  . Hypertension Father   . Hypertension Mother   . Cancer Mother     lung    Allergies  Allergen Reactions  . Aleve [Naproxen Sodium]     Current Outpatient Prescriptions on File Prior to Visit  Medication Sig Dispense Refill  . atorvastatin (LIPITOR) 40 MG tablet Take 1 tablet (40 mg total) by mouth daily. 30 tablet 5  . hydrochlorothiazide (HYDRODIURIL) 25 MG tablet TAKE ONE TABLET BY MOUTH ONCE DAILY 90 tablet 0   No current facility-administered medications on file prior to visit.    BP 130/86 mmHg  Pulse 67  Temp(Src) 97.7 F (36.5 C) (Oral)  Ht 5\' 3"  (1.6 m)  Wt 130 lb (58.968 kg)  BMI 23.03 kg/m2  SpO2 98%    Objective:   Physical Exam  Constitutional: She appears well-nourished.  Cardiovascular: Normal rate and regular rhythm.   Pulmonary/Chest: Effort normal and  breath sounds normal.  Skin: Skin is warm and dry.          Assessment & Plan:

## 2015-10-19 NOTE — Assessment & Plan Note (Signed)
Improved and stable on HCTZ 25 mg. BMP pending today. Continue current regimen.

## 2015-12-28 ENCOUNTER — Other Ambulatory Visit: Payer: Self-pay | Admitting: Primary Care

## 2016-01-24 ENCOUNTER — Other Ambulatory Visit: Payer: Self-pay | Admitting: Primary Care

## 2016-01-24 DIAGNOSIS — E785 Hyperlipidemia, unspecified: Secondary | ICD-10-CM

## 2016-01-24 MED ORDER — ATORVASTATIN CALCIUM 40 MG PO TABS: 40.0000 mg | ORAL_TABLET | Freq: Every day | ORAL | Status: DC

## 2016-01-24 NOTE — Telephone Encounter (Signed)
Patient called to request refill on Lipitor. Last seen on 10/19/2015. No future appt.

## 2016-05-22 ENCOUNTER — Telehealth: Payer: Self-pay | Admitting: Primary Care

## 2016-05-22 NOTE — Telephone Encounter (Signed)
Fine with me

## 2016-05-22 NOTE — Telephone Encounter (Signed)
Fine with me, yes physical can be done at initial appt

## 2016-05-22 NOTE — Telephone Encounter (Signed)
Can patient transfer to [place] from [place]?  Can patient be seen for cpx at first visit with [facility]?

## 2016-06-27 ENCOUNTER — Encounter: Payer: 59 | Admitting: Primary Care

## 2016-07-04 ENCOUNTER — Ambulatory Visit (INDEPENDENT_AMBULATORY_CARE_PROVIDER_SITE_OTHER): Payer: 59 | Admitting: Internal Medicine

## 2016-07-04 ENCOUNTER — Encounter: Payer: Self-pay | Admitting: Internal Medicine

## 2016-07-04 VITALS — BP 132/84 | HR 63 | Temp 98.9°F | Ht 63.0 in | Wt 126.0 lb

## 2016-07-04 DIAGNOSIS — Z1159 Encounter for screening for other viral diseases: Secondary | ICD-10-CM | POA: Diagnosis not present

## 2016-07-04 DIAGNOSIS — Z114 Encounter for screening for human immunodeficiency virus [HIV]: Secondary | ICD-10-CM

## 2016-07-04 DIAGNOSIS — Z0001 Encounter for general adult medical examination with abnormal findings: Secondary | ICD-10-CM

## 2016-07-04 DIAGNOSIS — Z0000 Encounter for general adult medical examination without abnormal findings: Secondary | ICD-10-CM

## 2016-07-04 DIAGNOSIS — Z1231 Encounter for screening mammogram for malignant neoplasm of breast: Secondary | ICD-10-CM | POA: Diagnosis not present

## 2016-07-04 DIAGNOSIS — E785 Hyperlipidemia, unspecified: Secondary | ICD-10-CM

## 2016-07-04 DIAGNOSIS — I10 Essential (primary) hypertension: Secondary | ICD-10-CM

## 2016-07-04 DIAGNOSIS — Z1239 Encounter for other screening for malignant neoplasm of breast: Secondary | ICD-10-CM

## 2016-07-04 DIAGNOSIS — E7800 Pure hypercholesterolemia, unspecified: Secondary | ICD-10-CM

## 2016-07-04 LAB — COMPREHENSIVE METABOLIC PANEL
ALT: 18 U/L (ref 0–35)
AST: 20 U/L (ref 0–37)
Albumin: 4.4 g/dL (ref 3.5–5.2)
Alkaline Phosphatase: 69 U/L (ref 39–117)
BILIRUBIN TOTAL: 0.5 mg/dL (ref 0.2–1.2)
BUN: 19 mg/dL (ref 6–23)
CALCIUM: 10.2 mg/dL (ref 8.4–10.5)
CHLORIDE: 103 meq/L (ref 96–112)
CO2: 30 meq/L (ref 19–32)
Creatinine, Ser: 0.67 mg/dL (ref 0.40–1.20)
GFR: 96.89 mL/min (ref >60.00)
GLUCOSE: 88 mg/dL (ref 70–99)
POTASSIUM: 3.3 meq/L — AB (ref 3.5–5.1)
Sodium: 141 mEq/L (ref 135–145)
Total Protein: 7.7 g/dL (ref 6.0–8.3)

## 2016-07-04 LAB — CBC
HCT: 37.2 % (ref 36.0–46.0)
Hemoglobin: 12.5 g/dL (ref 12.0–15.0)
MCHC: 33.6 g/dL (ref 30.0–36.0)
MCV: 84.4 fl (ref 78.0–100.0)
Platelets: 168 K/uL (ref 150.0–400.0)
RBC: 4.41 Mil/uL (ref 3.87–5.11)
RDW: 13.8 % (ref 11.5–15.5)
WBC: 5.9 K/uL (ref 4.0–10.5)

## 2016-07-04 LAB — LIPID PANEL
CHOL/HDL RATIO: 3
Cholesterol: 174 mg/dL (ref 0–200)
HDL: 58.9 mg/dL (ref >39.00)
LDL Cholesterol: 99 mg/dL (ref 0–99)
NONHDL: 114.87
TRIGLYCERIDES: 80 mg/dL (ref 0.0–149.0)
VLDL: 16 mg/dL (ref 0.0–40.0)

## 2016-07-04 MED ORDER — ATORVASTATIN CALCIUM 40 MG PO TABS: 40.0000 mg | ORAL_TABLET | Freq: Every day | ORAL | 3 refills | Status: DC

## 2016-07-04 MED ORDER — HYDROCHLOROTHIAZIDE 25 MG PO TABS: 25.0000 mg | ORAL_TABLET | Freq: Every day | ORAL | 3 refills | Status: DC

## 2016-07-04 NOTE — Patient Instructions (Signed)
Health Maintenance, Female Adopting a healthy lifestyle and getting preventive care can go a long way to promote health and wellness. Talk with your health care provider about what schedule of regular examinations is right for you. This is a good chance for you to check in with your provider about disease prevention and staying healthy. In between checkups, there are plenty of things you can do on your own. Experts have done a lot of research about which lifestyle changes and preventive measures are most likely to keep you healthy. Ask your health care provider for more information. WEIGHT AND DIET  Eat a healthy diet  Be sure to include plenty of vegetables, fruits, low-fat dairy products, and lean protein.  Do not eat a lot of foods high in solid fats, added sugars, or salt.  Get regular exercise. This is one of the most important things you can do for your health.  Most adults should exercise for at least 150 minutes each week. The exercise should increase your heart rate and make you sweat (moderate-intensity exercise).  Most adults should also do strengthening exercises at least twice a week. This is in addition to the moderate-intensity exercise.  Maintain a healthy weight  Body mass index (BMI) is a measurement that can be used to identify possible weight problems. It estimates body fat based on height and weight. Your health care provider can help determine your BMI and help you achieve or maintain a healthy weight.  For females 20 years of age and older:   A BMI below 18.5 is considered underweight.  A BMI of 18.5 to 24.9 is normal.  A BMI of 25 to 29.9 is considered overweight.  A BMI of 30 and above is considered obese.  Watch levels of cholesterol and blood lipids  You should start having your blood tested for lipids and cholesterol at 55 years of age, then have this test every 5 years.  You may need to have your cholesterol levels checked more often if:  Your lipid  or cholesterol levels are high.  You are older than 55 years of age.  You are at high risk for heart disease.  CANCER SCREENING   Lung Cancer  Lung cancer screening is recommended for adults 55-80 years old who are at high risk for lung cancer because of a history of smoking.  A yearly low-dose CT scan of the lungs is recommended for people who:  Currently smoke.  Have quit within the past 15 years.  Have at least a 30-pack-year history of smoking. A pack year is smoking an average of one pack of cigarettes a day for 1 year.  Yearly screening should continue until it has been 15 years since you quit.  Yearly screening should stop if you develop a health problem that would prevent you from having lung cancer treatment.  Breast Cancer  Practice breast self-awareness. This means understanding how your breasts normally appear and feel.  It also means doing regular breast self-exams. Let your health care provider know about any changes, no matter how small.  If you are in your 20s or 30s, you should have a clinical breast exam (CBE) by a health care provider every 1-3 years as part of a regular health exam.  If you are 40 or older, have a CBE every year. Also consider having a breast X-ray (mammogram) every year.  If you have a family history of breast cancer, talk to your health care provider about genetic screening.  If you   are at high risk for breast cancer, talk to your health care provider about having an MRI and a mammogram every year.  Breast cancer gene (BRCA) assessment is recommended for women who have family members with BRCA-related cancers. BRCA-related cancers include:  Breast.  Ovarian.  Tubal.  Peritoneal cancers.  Results of the assessment will determine the need for genetic counseling and BRCA1 and BRCA2 testing. Cervical Cancer Your health care provider may recommend that you be screened regularly for cancer of the pelvic organs (ovaries, uterus, and  vagina). This screening involves a pelvic examination, including checking for microscopic changes to the surface of your cervix (Pap test). You may be encouraged to have this screening done every 3 years, beginning at age 21.  For women ages 30-65, health care providers may recommend pelvic exams and Pap testing every 3 years, or they may recommend the Pap and pelvic exam, combined with testing for human papilloma virus (HPV), every 5 years. Some types of HPV increase your risk of cervical cancer. Testing for HPV may also be done on women of any age with unclear Pap test results.  Other health care providers may not recommend any screening for nonpregnant women who are considered low risk for pelvic cancer and who do not have symptoms. Ask your health care provider if a screening pelvic exam is right for you.  If you have had past treatment for cervical cancer or a condition that could lead to cancer, you need Pap tests and screening for cancer for at least 20 years after your treatment. If Pap tests have been discontinued, your risk factors (such as having a new sexual partner) need to be reassessed to determine if screening should resume. Some women have medical problems that increase the chance of getting cervical cancer. In these cases, your health care provider may recommend more frequent screening and Pap tests. Colorectal Cancer  This type of cancer can be detected and often prevented.  Routine colorectal cancer screening usually begins at 55 years of age and continues through 55 years of age.  Your health care provider may recommend screening at an earlier age if you have risk factors for colon cancer.  Your health care provider may also recommend using home test kits to check for hidden blood in the stool.  A small camera at the end of a tube can be used to examine your colon directly (sigmoidoscopy or colonoscopy). This is done to check for the earliest forms of colorectal  cancer.  Routine screening usually begins at age 50.  Direct examination of the colon should be repeated every 5-10 years through 55 years of age. However, you may need to be screened more often if early forms of precancerous polyps or small growths are found. Skin Cancer  Check your skin from head to toe regularly.  Tell your health care provider about any new moles or changes in moles, especially if there is a change in a mole's shape or color.  Also tell your health care provider if you have a mole that is larger than the size of a pencil eraser.  Always use sunscreen. Apply sunscreen liberally and repeatedly throughout the day.  Protect yourself by wearing long sleeves, pants, a wide-brimmed hat, and sunglasses whenever you are outside. HEART DISEASE, DIABETES, AND HIGH BLOOD PRESSURE   High blood pressure causes heart disease and increases the risk of stroke. High blood pressure is more likely to develop in:  People who have blood pressure in the high end   of the normal range (130-139/85-89 mm Hg).  People who are overweight or obese.  People who are African American.  If you are 38-23 years of age, have your blood pressure checked every 3-5 years. If you are 61 years of age or older, have your blood pressure checked every year. You should have your blood pressure measured twice--once when you are at a hospital or clinic, and once when you are not at a hospital or clinic. Record the average of the two measurements. To check your blood pressure when you are not at a hospital or clinic, you can use:  An automated blood pressure machine at a pharmacy.  A home blood pressure monitor.  If you are between 45 years and 39 years old, ask your health care provider if you should take aspirin to prevent strokes.  Have regular diabetes screenings. This involves taking a blood sample to check your fasting blood sugar level.  If you are at a normal weight and have a low risk for diabetes,  have this test once every three years after 55 years of age.  If you are overweight and have a high risk for diabetes, consider being tested at a younger age or more often. PREVENTING INFECTION  Hepatitis B  If you have a higher risk for hepatitis B, you should be screened for this virus. You are considered at high risk for hepatitis B if:  You were born in a country where hepatitis B is common. Ask your health care provider which countries are considered high risk.  Your parents were born in a high-risk country, and you have not been immunized against hepatitis B (hepatitis B vaccine).  You have HIV or AIDS.  You use needles to inject street drugs.  You live with someone who has hepatitis B.  You have had sex with someone who has hepatitis B.  You get hemodialysis treatment.  You take certain medicines for conditions, including cancer, organ transplantation, and autoimmune conditions. Hepatitis C  Blood testing is recommended for:  Everyone born from [birth year] through 1965.  Anyone with known risk factors for hepatitis C. Sexually transmitted infections (STIs)  You should be screened for sexually transmitted infections (STIs) including gonorrhea and chlamydia if:  You are sexually active and are younger than 55 years of age.  You are older than 55 years of age and your health care provider tells you that you are at risk for this type of infection.  Your sexual activity has changed since you were last screened and you are at an increased risk for chlamydia or gonorrhea. Ask your health care provider if you are at risk.  If you do not have HIV, but are at risk, it may be recommended that you take a prescription medicine daily to prevent HIV infection. This is called pre-exposure prophylaxis (PrEP). You are considered at risk if:  You are sexually active and do not regularly use condoms or know the HIV status of your partner(s).  You take drugs by injection.  You are sexually  active with a partner who has HIV. Talk with your health care provider about whether you are at high risk of being infected with HIV. If you choose to begin PrEP, you should first be tested for HIV. You should then be tested every 3 months for as long as you are taking PrEP.  PREGNANCY   If you are premenopausal and you may become pregnant, ask your health care provider about preconception counseling.  If you may  become pregnant, take 400 to 800 micrograms (mcg) of folic acid every day.  If you want to prevent pregnancy, talk to your health care provider about birth control (contraception). OSTEOPOROSIS AND MENOPAUSE   Osteoporosis is a disease in which the bones lose minerals and strength with aging. This can result in serious bone fractures. Your risk for osteoporosis can be identified using a bone density scan.  If you are 61 years of age or older, or if you are at risk for osteoporosis and fractures, ask your health care provider if you should be screened.  Ask your health care provider whether you should take a calcium or vitamin D supplement to lower your risk for osteoporosis.  Menopause may have certain physical symptoms and risks.  Hormone replacement therapy may reduce some of these symptoms and risks. Talk to your health care provider about whether hormone replacement therapy is right for you.  HOME CARE INSTRUCTIONS   Schedule regular health, dental, and eye exams.  Stay current with your immunizations.   Do not use any tobacco products including cigarettes, chewing tobacco, or electronic cigarettes.  If you are pregnant, do not drink alcohol.  If you are breastfeeding, limit how much and how often you drink alcohol.  Limit alcohol intake to no more than 1 drink per day for nonpregnant women. One drink equals 12 ounces of beer, 5 ounces of wine, or 1 ounces of hard liquor.  Do not use street drugs.  Do not share needles.  Ask your health care provider for help if  you need support or information about quitting drugs.  Tell your health care provider if you often feel depressed.  Tell your health care provider if you have ever been abused or do not feel safe at home.   This information is not intended to replace advice given to you by your health care provider. Make sure you discuss any questions you have with your health care provider.   Document Released: 03/10/2011 Document Revised: 09/15/2014 Document Reviewed: 07/27/2013 Elsevier Interactive Patient Education [employer].

## 2016-07-04 NOTE — Progress Notes (Signed)
Subjective:    Patient ID: Sandra Ball, female    DOB: 12/09/1960, 55 y.o.   MRN: [id number]  HPI  Pt presents to the clinic today to establish care and for management of the conditions listed below. She is transferring care from Allie Bossier, NP. She would like her annual exam today.   HLD: her last LDL was 144, 09/2014. She is taking Lipitor as prescribed. She does not try to consume a low fat diet.  HTN: BP controlled on HCTZ. Her BP today is 132/84. There is no ECG on file.  Flu: 06/2016 Tetanus: 06/2014 Pap Smear: 06/2013- normal Mammogram :07/2013 Colon Screening: 02/2014 Vision Screening: annually 04/2016 Dentist: annually  Diet: She does eat meat. She consumes fruits and veggies most days of the week. She does eat some fried food. She drinks water and soda Exercise: None  Review of Systems      Past Medical History:  Diagnosis Date  . Hyperlipidemia   . Hypertension   . Ocular disease    Dr. Reino Bellis    Current Outpatient Prescriptions  Medication Sig Dispense Refill  . atorvastatin (LIPITOR) 40 MG tablet Take 1 tablet (40 mg total) by mouth daily. 90 tablet 1  . hydrochlorothiazide (HYDRODIURIL) 25 MG tablet TAKE ONE TABLET BY MOUTH ONCE DAILY 90 tablet 3   No current facility-administered medications for this visit.     Allergies  Allergen Reactions  . Aleve [Naproxen Sodium]     Family History  Problem Relation Age of Onset  . Hypertension Father   . Hypertension Mother   . Cancer Mother     lung    Social History   Social History  . Marital status: Married    Spouse name: N/A  . Number of children: 3  . Years of education: N/A   Occupational History  . Not on file.   Social History Main Topics  . Smoking status: Never Smoker  . Smokeless tobacco: Not on file  . Alcohol use No  . Drug use: No  . Sexual activity: Yes    Birth control/ protection: None     Comment: divorced; 1 partner in past yr   Other Topics Concern  . Not on file    Social History Narrative   Married.   Works to [employer].   3 children.   Enjoys being outdoors in the garden.      Constitutional: Denies fever, malaise, fatigue, headache or abrupt weight changes.  HEENT: Denies eye pain, eye redness, ear pain, ringing in the ears, wax buildup, runny nose, nasal congestion, bloody nose, or sore throat. Respiratory: Denies difficulty breathing, shortness of breath, cough or sputum production.   Cardiovascular: Denies chest pain, chest tightness, palpitations or swelling in the hands or feet.  Gastrointestinal: Pt reports constipation. Denies abdominal pain, bloating, diarrhea or blood in the stool.  GU: Denies urgency, frequency, pain with urination, burning sensation, blood in urine, odor or discharge. Musculoskeletal: Denies decrease in range of motion, difficulty with gait, muscle pain or joint pain and swelling.  Skin: Denies redness, rashes, lesions or ulcercations.  Neurological: Denies dizziness, difficulty with memory, difficulty with speech or problems with balance and coordination.  Psych: Denies anxiety, depression, SI/HI.  No other specific complaints in a complete review of systems (except as listed in HPI above).  Objective:   Physical Exam  BP 132/84   Pulse 63   Temp 98.9 F (37.2 C) (Oral)   Ht 5\' 3"  (1.6 m)  Wt 126 lb (57.2 kg)   SpO2 98%   BMI 22.32 kg/m   Wt Readings from Last 3 Encounters:  10/19/15 130 lb (59 kg)  06/29/15 136 lb 6.4 oz (61.9 kg)  04/20/15 138 lb (62.6 kg)    General: Appears her stated age, well developed, well nourished in NAD. Skin: Warm, dry and intact.  HEENT: Head: normal shape and size; Eyes: sclera white, no icterus, conjunctiva pink, PERRLA and EOMs intact; Ears: Tm's gray and intact, normal light reflex;  Throat/Mouth: Teeth present, mucosa pink and moist, no exudate, lesions or ulcerations noted.  Neck:  Neck supple, trachea midline. No masses, lumps or thyromegaly present.   Cardiovascular: Normal rate and rhythm. S1,S2 noted.  No murmur, rubs or gallops noted. No JVD or BLE edema. No carotid bruits noted. Pulmonary/Chest: Normal effort and positive vesicular breath sounds. No respiratory distress. No wheezes, rales or ronchi noted.  Abdomen: Soft and nontender. Normal bowel sounds. No distention or masses noted. Liver, spleen and kidneys non palpable. Musculoskeletal: Normal range of motion. Strength 5/5 BUE/BLE. No difficulty with gait.  Neurological: Alert and oriented. Cranial nerves II-XII grossly intact. Coordination normal.  Psychiatric: Mood and affect normal. Behavior is normal. Judgment and thought content normal.     BMET    Component Value Date/Time   NA 140 10/19/2015 1406   K 3.7 10/19/2015 1406   CL 103 10/19/2015 1406   CO2 30 10/19/2015 1406   GLUCOSE 93 10/19/2015 1406   BUN 14 10/19/2015 1406   CREATININE 0.63 10/19/2015 1406   CALCIUM 10.4 10/19/2015 1406    Lipid Panel     Component Value Date/Time   CHOL 187 10/19/2015 1406   TRIG 96.0 10/19/2015 1406   HDL 51.40 10/19/2015 1406   CHOLHDL 4 10/19/2015 1406   VLDL 19.2 10/19/2015 1406   LDLCALC 116 (H) 10/19/2015 1406    CBC No results found for: WBC, RBC, HGB, HCT, PLT, MCV, MCH, MCHC, RDW, LYMPHSABS, MONOABS, EOSABS, BASOSABS  Hgb A1C Lab Results  Component Value Date   HGBA1C 6.1 10/19/2015        Assessment & Plan:   Preventative Health Maintenance:  Flu and tetanus UTD Pap smear UTD Mammogram ordered- she will call to schedule Colon Screening UTD Encouraged her to consume a balanced diet and exercise regimen Advised her to see an eye doctor and dentist annually Will check CBC, CMET, Lipid, HIV and Hep C today  RTC in 1 year, sooner if needed Webb Silversmith, NP

## 2016-07-05 LAB — HEPATITIS C ANTIBODY: HCV Ab: NEGATIVE

## 2016-07-05 LAB — HIV ANTIBODY (ROUTINE TESTING W REFLEX): HIV: NONREACTIVE

## 2016-07-07 ENCOUNTER — Encounter: Payer: Self-pay | Admitting: Internal Medicine

## 2016-07-07 NOTE — Assessment & Plan Note (Addendum)
Lipid panel and CMET today Encouraged her to consume a low fat diet Continue Lipitor for now Medication refilled today

## 2016-07-07 NOTE — Assessment & Plan Note (Signed)
Controlled on HCTZ CMET today Medication refilled today

## 2016-07-09 MED ORDER — POTASSIUM CHLORIDE ER 10 MEQ PO TBCR: 10.0000 meq | EXTENDED_RELEASE_TABLET | Freq: Every day | ORAL | 0 refills | Status: DC

## 2016-07-09 NOTE — Addendum Note (Signed)
Addended by: Lurlean Nanny on: 07/09/2016 10:27 AM   Modules accepted: Orders

## 2016-08-18 ENCOUNTER — Encounter: Payer: Self-pay | Admitting: Internal Medicine

## 2016-10-18 ENCOUNTER — Other Ambulatory Visit: Payer: Self-pay | Admitting: Internal Medicine

## 2017-05-16 ENCOUNTER — Other Ambulatory Visit: Payer: Self-pay | Admitting: Internal Medicine

## 2017-05-18 NOTE — Telephone Encounter (Signed)
CPE reminder letter mailed 

## 2017-07-03 ENCOUNTER — Other Ambulatory Visit: Payer: Self-pay

## 2017-07-03 ENCOUNTER — Encounter: Payer: 59 | Admitting: Internal Medicine

## 2017-07-03 DIAGNOSIS — E7800 Pure hypercholesterolemia, unspecified: Secondary | ICD-10-CM

## 2017-07-03 DIAGNOSIS — I10 Essential (primary) hypertension: Secondary | ICD-10-CM

## 2017-07-03 MED ORDER — HYDROCHLOROTHIAZIDE 25 MG PO TABS: 25.0000 mg | ORAL_TABLET | Freq: Every day | ORAL | 0 refills | Status: DC

## 2017-07-03 MED ORDER — ATORVASTATIN CALCIUM 40 MG PO TABS: 40.0000 mg | ORAL_TABLET | Freq: Every day | ORAL | 0 refills | Status: DC

## 2017-07-10 ENCOUNTER — Other Ambulatory Visit (HOSPITAL_COMMUNITY)
Admission: RE | Admit: 2017-07-10 | Discharge: 2017-07-10 | Disposition: A | Discharge status: Home / Self Care | Payer: 59 | Source: Ambulatory Visit | Attending: Family Medicine | Admitting: Family Medicine

## 2017-07-10 ENCOUNTER — Encounter: Payer: Self-pay | Admitting: Family Medicine

## 2017-07-10 ENCOUNTER — Ambulatory Visit (INDEPENDENT_AMBULATORY_CARE_PROVIDER_SITE_OTHER): Payer: 59 | Admitting: Family Medicine

## 2017-07-10 VITALS — BP 130/88 | HR 80 | Temp 98.0°F | Ht 63.0 in | Wt 135.5 lb

## 2017-07-10 DIAGNOSIS — E559 Vitamin D deficiency, unspecified: Secondary | ICD-10-CM | POA: Diagnosis not present

## 2017-07-10 DIAGNOSIS — Z0000 Encounter for general adult medical examination without abnormal findings: Secondary | ICD-10-CM | POA: Insufficient documentation

## 2017-07-10 DIAGNOSIS — Z124 Encounter for screening for malignant neoplasm of cervix: Secondary | ICD-10-CM | POA: Insufficient documentation

## 2017-07-10 DIAGNOSIS — I10 Essential (primary) hypertension: Secondary | ICD-10-CM | POA: Insufficient documentation

## 2017-07-10 DIAGNOSIS — E7800 Pure hypercholesterolemia, unspecified: Secondary | ICD-10-CM | POA: Diagnosis not present

## 2017-07-10 DIAGNOSIS — R7303 Prediabetes: Secondary | ICD-10-CM | POA: Diagnosis not present

## 2017-07-10 LAB — COMPREHENSIVE METABOLIC PANEL
ALT: 24 U/L (ref 0–35)
AST: 24 U/L (ref 0–37)
Albumin: 4.5 g/dL (ref 3.5–5.2)
Alkaline Phosphatase: 72 U/L (ref 39–117)
BILIRUBIN TOTAL: 0.5 mg/dL (ref 0.2–1.2)
BUN: 12 mg/dL (ref 6–23)
CHLORIDE: 105 meq/L (ref 96–112)
CO2: 30 meq/L (ref 19–32)
CREATININE: 0.61 mg/dL (ref 0.40–1.20)
Calcium: 10.3 mg/dL (ref 8.4–10.5)
GFR: 107.58 mL/min (ref >60.00)
GLUCOSE: 92 mg/dL (ref 70–99)
Potassium: 3.5 mEq/L (ref 3.5–5.1)
Sodium: 141 mEq/L (ref 135–145)
Total Protein: 7.9 g/dL (ref 6.0–8.3)

## 2017-07-10 LAB — TSH: TSH: 3.07 u[IU]/mL (ref 0.35–4.50)

## 2017-07-10 LAB — LIPID PANEL
CHOL/HDL RATIO: 3
Cholesterol: 185 mg/dL (ref 0–200)
HDL: 58.4 mg/dL (ref >39.00)
LDL CALC: 106 mg/dL — AB (ref 0–99)
NonHDL: 126.1
Triglycerides: 100 mg/dL (ref 0.0–149.0)
VLDL: 20 mg/dL (ref 0.0–40.0)

## 2017-07-10 LAB — CBC
HCT: 38.1 % (ref 36.0–46.0)
Hemoglobin: 12.4 g/dL (ref 12.0–15.0)
MCHC: 32.6 g/dL (ref 30.0–36.0)
MCV: 87 fl (ref 78.0–100.0)
Platelets: 182 10*3/uL (ref 150.0–400.0)
RBC: 4.38 Mil/uL (ref 3.87–5.11)
RDW: 14 % (ref 11.5–15.5)
WBC: 6.3 10*3/uL (ref 4.0–10.5)

## 2017-07-10 LAB — VITAMIN D 25 HYDROXY (VIT D DEFICIENCY, FRACTURES): VITD: 31.05 ng/mL (ref 30.00–100.00)

## 2017-07-10 LAB — HEMOGLOBIN A1C: Hgb A1c MFr Bld: 6 % (ref 4.6–6.5)

## 2017-07-10 MED ORDER — HYDROCHLOROTHIAZIDE 25 MG PO TABS: 25.0000 mg | ORAL_TABLET | Freq: Every day | ORAL | 3 refills | Status: DC

## 2017-07-10 MED ORDER — ATORVASTATIN CALCIUM 40 MG PO TABS: 40.0000 mg | ORAL_TABLET | Freq: Every day | ORAL | 3 refills | Status: DC

## 2017-07-10 NOTE — Patient Instructions (Signed)
It was a pleasure to meet you today     Keeping You Healthy  Get These Tests  Blood Pressure- Have your blood pressure checked by your healthcare provider at least once a year.  Normal blood pressure is 120/80.  Weight- Have your body mass index (BMI) calculated to screen for obesity.  BMI is a measure of body fat based on height and weight.  You can calculate your own BMI at [employer]  Cholesterol- Have your cholesterol checked every year.  Diabetes- Have your blood sugar checked every year if you have high blood pressure, high cholesterol, a family history of diabetes or if you are overweight.  Pap Test - Have a pap test every 1 to 5 years if you have been sexually active.  If you are older than 65 and recent pap tests have been normal you may not need additional pap tests.  In addition, if you have had a hysterectomy  for benign disease additional pap tests are not necessary.  Mammogram-Yearly mammograms are essential for early detection of breast cancer  Screening for Colon Cancer- Colonoscopy starting at age 55. Screening may begin sooner depending on your family history and other health conditions.  Follow up colonoscopy as directed by your Gastroenterologist.  Screening for Osteoporosis- Screening begins at age 52 with bone density scanning, sooner if you are at higher risk for developing Osteoporosis.  Get these medicines  Calcium with Vitamin D- Your body requires 1200-1500 mg of Calcium a day and [phone] IU of Vitamin D a day.  You can only absorb 500 mg of Calcium at a time therefore Calcium must be taken in 2 or 3 separate doses throughout the day.  Hormones- Hormone therapy has been associated with increased risk for certain cancers and heart disease.  Talk to your healthcare provider about if you need relief from menopausal symptoms.  Aspirin- Ask your healthcare provider about taking Aspirin to prevent Heart Disease and Stroke.  Get these  Immuniztions  Flu shot- Every fall  Pneumonia shot- Once after the age of 20; if you are younger ask your healthcare provider if you need a pneumonia shot.  Tetanus- Every ten years.  Zostavax- Once after the age of 64 to prevent shingles.  Take these steps  Don't smoke- Your healthcare provider can help you quit. For tips on how to quit, ask your healthcare provider or go to www.smokefree.gov or call 1-800 QUIT-NOW.  Be physically active- Exercise 5 days a week for a minimum of 30 minutes.  If you are not already physically active, start slow and gradually work up to 30 minutes of moderate physical activity.  Try walking, dancing, bike riding, swimming, etc.  Eat a healthy diet- Eat a variety of healthy foods such as fruits, vegetables, whole grains, low fat milk, low fat cheeses, yogurt, lean meats, chicken, fish, eggs, dried beans, tofu, etc.  For more information go to www.thenutritionsource.org  Dental visit- Brush and floss teeth twice daily; visit your dentist twice a year.  Eye exam- Visit your Optometrist or Ophthalmologist yearly.  Drink alcohol in moderation- Limit alcohol intake to one drink or less a day.  Never drink and drive.  Depression- Your emotional health is as important as your physical health.  If you're feeling down or losing interest in things you normally enjoy, please talk to your healthcare provider.  Seat Belts- can save your life; always wear one  Smoke/Carbon Monoxide detectors- These detectors need to be installed on the appropriate level of your  home.  Replace batteries at least once a year.  Violence- If anyone is threatening or hurting you, please tell your healthcare provider.  Living Will/ Health care power of attorney- Discuss with your healthcare provider and family.

## 2017-07-10 NOTE — Progress Notes (Signed)
Subjective:    Patient ID: Sandra Ball, female    DOB: Dec 26, 1960, 56 y.o.   MRN: [id number]  HPI This is a 56 yo female who presents today for CPE. She is the mother of Vallarie Mare, who is a CMA in this office. She works as a Insurance account manager. Lives with her husband of a couple of months. They have been together for about 10 years. Enjoys gardening.   Has hot flashes a couple of times a day.    Last CPE- 10/17 Mammo- 08/15/16 Pap- 06/21/13 Colonoscopy- [facility] when she was 70, need to get copy of report Tdap- 07/07/14 Flu- already had Eye- sees regularly for occular disease, blood clot in eye, gets injections Dental- regular Exercise- walks a lot at work and gardening/household chores   Past Medical History:  Diagnosis Date  . Hyperlipidemia   . Hypertension   . Ocular disease    Dr. Reino Bellis   Past Surgical History:  Procedure Laterality Date  . BREAST SURGERY    . TUBAL LIGATION     Family History  Problem Relation Age of Onset  . Hypertension Father   . Hypertension Mother   . Cancer Mother        lung   Social History  Substance Use Topics  . Smoking status: Never Smoker  . Smokeless tobacco: Never Used  . Alcohol use No     Review of Systems  Constitutional: Negative for fatigue and fever.  HENT: Negative.   Eyes: Negative.   Respiratory: Positive for cough (occasional). Negative for shortness of breath.   Cardiovascular: Negative for chest pain and leg swelling.  Gastrointestinal: Positive for constipation (occasional).  Genitourinary: Negative.   Musculoskeletal:       Occasional pain of right calf, feels sore.  Occasional pain in feet.   Skin: Negative.   Neurological: Positive for headaches (rare). Negative for dizziness and light-headedness.  Psychiatric/Behavioral: Negative for dysphoric mood and sleep disturbance. The patient is not nervous/anxious.        Objective:   Physical Exam Physical Exam  Constitutional: She is oriented to person, place, and  time. She appears well-developed and well-nourished. No distress.  HENT:  Head: Normocephalic and atraumatic.  Right Ear: External ear normal.  Left Ear: External ear normal.  Nose: Nose normal.  Mouth/Throat: Oropharynx is clear and moist. No oropharyngeal exudate.  Eyes: Conjunctivae are normal. Pupils are equal, round, and reactive to light.  Neck: Normal range of motion. Neck supple. No JVD present. No thyromegaly present.  Cardiovascular: Normal rate, regular rhythm, normal heart sounds and intact distal pulses.   Pulmonary/Chest: Effort normal and breath sounds normal. Right breast exhibits no inverted nipple, no mass, no nipple discharge, no skin change and no tenderness. Left breast exhibits no inverted nipple, no mass, no nipple discharge, no skin change and no tenderness. Breasts are symmetrical.  Abdominal: Soft. Bowel sounds are normal. She exhibits no distension and no mass. There is no tenderness. There is no rebound and no guarding.  Genitourinary: Vagina normal. Pelvic exam was performed with patient supine. There is no rash, tenderness, lesion or injury on the right labia. There is no rash, tenderness, lesion or injury on the left labia. Cervix exhibits no motion tenderness and no discharge. No vaginal discharge found.  Musculoskeletal: Normal range of motion. She exhibits no edema or tenderness.  Lymphadenopathy:    She has no cervical adenopathy.  Neurological: She is alert and oriented to person, place, and time. She has normal  reflexes.  Skin: Skin is warm and dry. She is not diaphoretic.  Psychiatric: She has a normal mood and affect. Her behavior is normal. Judgment and thought content normal.  Vitals reviewed.  BP 130/88 (BP Location: Right Arm, Patient Position: Sitting, Cuff Size: Normal)   Pulse 80   Temp 98 F (36.7 C) (Oral)   Ht 5\' 3"  (1.6 m)   Wt 135 lb 8 oz (61.5 kg)   SpO2 97%   BMI 24.00 kg/m  Wt Readings from Last 3 Encounters:  07/10/17 135 lb 8 oz  (61.5 kg)  07/04/16 126 lb (57.2 kg)  10/19/15 130 lb (59 kg)       Assessment & Plan:  1. Annual physical exam -- Discussed and encouraged healthy lifestyle choices- adequate sleep, regular exercise, stress management and healthy food choices.   2. Essential hypertension - continue current med (HCTZ), has had hypokalemia in past, will check today and reorder potassium supplement if needed - CBC - Comprehensive metabolic panel - TSH - atorvastatin (LIPITOR) 40 MG tablet; Take 1 tablet (40 mg total) by mouth daily.  Dispense: 90 tablet; Refill: 3  3. Pure hypercholesterolemia - Lipid panel - TSH - hydrochlorothiazide (HYDRODIURIL) 25 MG tablet; Take 1 tablet (25 mg total) by mouth daily.  Dispense: 90 tablet; Refill: 3  4. Vitamin D deficiency - VITAMIN D 25 Hydroxy (Vit-D Deficiency, Fractures)  5. Borderline diabetes - Hemoglobin A1c  6. Screening for cervical cancer - Cytology - PAP  - follow up with PCP  Clarene Reamer, FNP-BC  [facility]  07/13/2017 8:21 AM

## 2017-07-14 LAB — CYTOLOGY - PAP
Diagnosis: NEGATIVE
HPV (WINDOPATH): NOT DETECTED

## 2017-07-16 ENCOUNTER — Encounter: Payer: Self-pay | Admitting: Family Medicine

## 2017-07-17 ENCOUNTER — Telehealth: Payer: Self-pay

## 2017-07-17 NOTE — Telephone Encounter (Signed)
Created general note stating date of physical/thx dmf

## 2018-07-16 ENCOUNTER — Encounter: Payer: Self-pay | Admitting: Internal Medicine

## 2018-07-16 ENCOUNTER — Ambulatory Visit (INDEPENDENT_AMBULATORY_CARE_PROVIDER_SITE_OTHER): Payer: 59 | Admitting: Internal Medicine

## 2018-07-16 VITALS — BP 128/84 | HR 71 | Temp 98.2°F | Ht 61.5 in | Wt 135.0 lb

## 2018-07-16 DIAGNOSIS — Z0000 Encounter for general adult medical examination without abnormal findings: Secondary | ICD-10-CM

## 2018-07-16 DIAGNOSIS — I10 Essential (primary) hypertension: Secondary | ICD-10-CM

## 2018-07-16 DIAGNOSIS — E559 Vitamin D deficiency, unspecified: Secondary | ICD-10-CM | POA: Diagnosis not present

## 2018-07-16 DIAGNOSIS — Z1239 Encounter for other screening for malignant neoplasm of breast: Secondary | ICD-10-CM

## 2018-07-16 DIAGNOSIS — E7800 Pure hypercholesterolemia, unspecified: Secondary | ICD-10-CM

## 2018-07-16 DIAGNOSIS — E785 Hyperlipidemia, unspecified: Secondary | ICD-10-CM

## 2018-07-16 LAB — LIPID PANEL
CHOLESTEROL: 187 mg/dL (ref 0–200)
HDL: 55.9 mg/dL (ref >39.00)
LDL CALC: 105 mg/dL — AB (ref 0–99)
NonHDL: 131.26
Total CHOL/HDL Ratio: 3
Triglycerides: 132 mg/dL (ref 0.0–149.0)
VLDL: 26.4 mg/dL (ref 0.0–40.0)

## 2018-07-16 LAB — CBC
HCT: 37.9 % (ref 36.0–46.0)
Hemoglobin: 12.6 g/dL (ref 12.0–15.0)
MCHC: 33.3 g/dL (ref 30.0–36.0)
MCV: 84.2 fl (ref 78.0–100.0)
Platelets: 183 10*3/uL (ref 150.0–400.0)
RBC: 4.5 Mil/uL (ref 3.87–5.11)
RDW: 13.7 % (ref 11.5–15.5)
WBC: 8.2 10*3/uL (ref 4.0–10.5)

## 2018-07-16 LAB — COMPREHENSIVE METABOLIC PANEL
ALBUMIN: 4.5 g/dL (ref 3.5–5.2)
ALK PHOS: 71 U/L (ref 39–117)
ALT: 18 U/L (ref 0–35)
AST: 22 U/L (ref 0–37)
BUN: 11 mg/dL (ref 6–23)
CHLORIDE: 104 meq/L (ref 96–112)
CO2: 28 mEq/L (ref 19–32)
CREATININE: 0.66 mg/dL (ref 0.40–1.20)
Calcium: 10.5 mg/dL (ref 8.4–10.5)
GFR: 97.87 mL/min (ref >60.00)
GLUCOSE: 96 mg/dL (ref 70–99)
Potassium: 3.3 mEq/L — ABNORMAL LOW (ref 3.5–5.1)
SODIUM: 141 meq/L (ref 135–145)
TOTAL PROTEIN: 7.9 g/dL (ref 6.0–8.3)
Total Bilirubin: 0.5 mg/dL (ref 0.2–1.2)

## 2018-07-16 LAB — VITAMIN D 25 HYDROXY (VIT D DEFICIENCY, FRACTURES): VITD: 23.27 ng/mL — AB (ref 30.00–100.00)

## 2018-07-16 NOTE — Patient Instructions (Signed)
Health Maintenance for Postmenopausal Women Menopause is a normal process in which your reproductive ability comes to an end. This process happens gradually over a span of months to years, usually between the ages of 22 and 9. Menopause is complete when you have missed 12 consecutive menstrual periods. It is important to talk with your health care provider about some of the most common conditions that affect postmenopausal women, such as heart disease, cancer, and bone loss (osteoporosis). Adopting a healthy lifestyle and getting preventive care can help to promote your health and wellness. Those actions can also lower your chances of developing some of these common conditions. What should I know about menopause? During menopause, you may experience a number of symptoms, such as:  Moderate-to-severe hot flashes.  Night sweats.  Decrease in sex drive.  Mood swings.  Headaches.  Tiredness.  Irritability.  Memory problems.  Insomnia.  Choosing to treat or not to treat menopausal changes is an individual decision that you make with your health care provider. What should I know about hormone replacement therapy and supplements? Hormone therapy products are effective for treating symptoms that are associated with menopause, such as hot flashes and night sweats. Hormone replacement carries certain risks, especially as you become older. If you are thinking about using estrogen or estrogen with progestin treatments, discuss the benefits and risks with your health care provider. What should I know about heart disease and stroke? Heart disease, heart attack, and stroke become more likely as you age. This may be due, in part, to the hormonal changes that your body experiences during menopause. These can affect how your body processes dietary fats, triglycerides, and cholesterol. Heart attack and stroke are both medical emergencies. There are many things that you can do to help prevent heart disease  and stroke:  Have your blood pressure checked at least every 1-2 years. High blood pressure causes heart disease and increases the risk of stroke.  If you are 53-22 years old, ask your health care provider if you should take aspirin to prevent a heart attack or a stroke.  Do not use any tobacco products, including cigarettes, chewing tobacco, or electronic cigarettes. If you need help quitting, ask your health care provider.  It is important to eat a healthy diet and maintain a healthy weight. ? Be sure to include plenty of vegetables, fruits, low-fat dairy products, and lean protein. ? Avoid eating foods that are high in solid fats, added sugars, or salt (sodium).  Get regular exercise. This is one of the most important things that you can do for your health. ? Try to exercise for at least 150 minutes each week. The type of exercise that you do should increase your heart rate and make you sweat. This is known as moderate-intensity exercise. ? Try to do strengthening exercises at least twice each week. Do these in addition to the moderate-intensity exercise.  Know your numbers.Ask your health care provider to check your cholesterol and your blood glucose. Continue to have your blood tested as directed by your health care provider.  What should I know about cancer screening? There are several types of cancer. Take the following steps to reduce your risk and to catch any cancer development as early as possible. Breast Cancer  Practice breast self-awareness. ? This means understanding how your breasts normally appear and feel. ? It also means doing regular breast self-exams. Let your health care provider know about any changes, no matter how small.  If you are 40  or older, have a clinician do a breast exam (clinical breast exam or CBE) every year. Depending on your age, family history, and medical history, it may be recommended that you also have a yearly breast X-ray (mammogram).  If you  have a family history of breast cancer, talk with your health care provider about genetic screening.  If you are at high risk for breast cancer, talk with your health care provider about having an MRI and a mammogram every year.  Breast cancer (BRCA) gene test is recommended for women who have family members with BRCA-related cancers. Results of the assessment will determine the need for genetic counseling and BRCA1 and for BRCA2 testing. BRCA-related cancers include these types: ? Breast. This occurs in males or females. ? Ovarian. ? Tubal. This may also be called fallopian tube cancer. ? Cancer of the abdominal or pelvic lining (peritoneal cancer). ? Prostate. ? Pancreatic.  Cervical, Uterine, and Ovarian Cancer Your health care provider may recommend that you be screened regularly for cancer of the pelvic organs. These include your ovaries, uterus, and vagina. This screening involves a pelvic exam, which includes checking for microscopic changes to the surface of your cervix (Pap test).  For women ages 21-65, health care providers may recommend a pelvic exam and a Pap test every three years. For women ages 79-65, they may recommend the Pap test and pelvic exam, combined with testing for human papilloma virus (HPV), every five years. Some types of HPV increase your risk of cervical cancer. Testing for HPV may also be done on women of any age who have unclear Pap test results.  Other health care providers may not recommend any screening for nonpregnant women who are considered low risk for pelvic cancer and have no symptoms. Ask your health care provider if a screening pelvic exam is right for you.  If you have had past treatment for cervical cancer or a condition that could lead to cancer, you need Pap tests and screening for cancer for at least 20 years after your treatment. If Pap tests have been discontinued for you, your risk factors (such as having a new sexual partner) need to be  reassessed to determine if you should start having screenings again. Some women have medical problems that increase the chance of getting cervical cancer. In these cases, your health care provider may recommend that you have screening and Pap tests more often.  If you have a family history of uterine cancer or ovarian cancer, talk with your health care provider about genetic screening.  If you have vaginal bleeding after reaching menopause, tell your health care provider.  There are currently no reliable tests available to screen for ovarian cancer.  Lung Cancer Lung cancer screening is recommended for adults 69-62 years old who are at high risk for lung cancer because of a history of smoking. A yearly low-dose CT scan of the lungs is recommended if you:  Currently smoke.  Have a history of at least 30 pack-years of smoking and you currently smoke or have quit within the past 15 years. A pack-year is smoking an average of one pack of cigarettes per day for one year.  Yearly screening should:  Continue until it has been 15 years since you quit.  Stop if you develop a health problem that would prevent you from having lung cancer treatment.  Colorectal Cancer  This type of cancer can be detected and can often be prevented.  Routine colorectal cancer screening usually begins at  age 42 and continues through age 45.  If you have risk factors for colon cancer, your health care provider may recommend that you be screened at an earlier age.  If you have a family history of colorectal cancer, talk with your health care provider about genetic screening.  Your health care provider may also recommend using home test kits to check for hidden blood in your stool.  A small camera at the end of a tube can be used to examine your colon directly (sigmoidoscopy or colonoscopy). This is done to check for the earliest forms of colorectal cancer.  Direct examination of the colon should be repeated every  5-10 years until age 71. However, if early forms of precancerous polyps or small growths are found or if you have a family history or genetic risk for colorectal cancer, you may need to be screened more often.  Skin Cancer  Check your skin from head to toe regularly.  Monitor any moles. Be sure to tell your health care provider: ? About any new moles or changes in moles, especially if there is a change in a mole's shape or color. ? If you have a mole that is larger than the size of a pencil eraser.  If any of your family members has a history of skin cancer, especially at a young age, talk with your health care provider about genetic screening.  Always use sunscreen. Apply sunscreen liberally and repeatedly throughout the day.  Whenever you are outside, protect yourself by wearing long sleeves, pants, a wide-brimmed hat, and sunglasses.  What should I know about osteoporosis? Osteoporosis is a condition in which bone destruction happens more quickly than new bone creation. After menopause, you may be at an increased risk for osteoporosis. To help prevent osteoporosis or the bone fractures that can happen because of osteoporosis, the following is recommended:  If you are 46-71 years old, get at least 1,000 mg of calcium and at least 600 mg of vitamin D per day.  If you are older than age 55 but younger than age 65, get at least 1,200 mg of calcium and at least 600 mg of vitamin D per day.  If you are older than age 54, get at least 1,200 mg of calcium and at least 800 mg of vitamin D per day.  Smoking and excessive alcohol intake increase the risk of osteoporosis. Eat foods that are rich in calcium and vitamin D, and do weight-bearing exercises several times each week as directed by your health care provider. What should I know about how menopause affects my mental health? Depression may occur at any age, but it is more common as you become older. Common symptoms of depression  include:  Low or sad mood.  Changes in sleep patterns.  Changes in appetite or eating patterns.  Feeling an overall lack of motivation or enjoyment of activities that you previously enjoyed.  Frequent crying spells.  Talk with your health care provider if you think that you are experiencing depression. What should I know about immunizations? It is important that you get and maintain your immunizations. These include:  Tetanus, diphtheria, and pertussis (Tdap) booster vaccine.  Influenza every year before the flu season begins.  Pneumonia vaccine.  Shingles vaccine.  Your health care provider may also recommend other immunizations. This information is not intended to replace advice given to you by your health care provider. Make sure you discuss any questions you have with your health care provider. Document Released: 10/17/2005  Document Revised: 03/14/2016 Document Reviewed: 05/29/2015 Elsevier Interactive Patient Education  2018 Elsevier Inc.  

## 2018-07-16 NOTE — Assessment & Plan Note (Signed)
Controlled on HCTZ CMET today May need to restart Potassium Reinforced DASH diet

## 2018-07-16 NOTE — Assessment & Plan Note (Signed)
CMET and Lipid profile today Encouraged her to consume a low fat diet Continue Simvastatin for now 

## 2018-07-16 NOTE — Progress Notes (Signed)
Subjective:    Patient ID: Sandra Ball, female    DOB: Dec 05, 1960, 57 y.o.   MRN: [id number]  HPI  Pt presents to the clinic today for her annual exam. She is also due to follow up chronic conditions.  HTN: Her BP today is 128/84. She is taking HCTZ but not her Potassium as prescribed. There is no ECG on file.  HLD: Her last LDL ws 106, 07/2017. She denies myalgias on Simvastatin. She tries to consume a low fat diet.  Flu: 06/2018 Tetanus: 06/2014 Pap Smear: 07/2017 Mammogram: 08/2016 Colon Screening: 02/2014 Vision Screening: annually Dentist: annually  Diet: She does eat some meat. She consumes some fruits and veggies. She does eat some fried foods. She drinks mostly coffee, some water. Exercise: None  Review of Systems      Past Medical History:  Diagnosis Date  . Hyperlipidemia   . Hypertension   . Ocular disease    Dr. Reino Bellis    Current Outpatient Medications  Medication Sig Dispense Refill  . atorvastatin (LIPITOR) 40 MG tablet Take 1 tablet (40 mg total) by mouth daily. 90 tablet 3  . hydrochlorothiazide (HYDRODIURIL) 25 MG tablet Take 1 tablet (25 mg total) by mouth daily. 90 tablet 3  . potassium chloride (K-DUR) 10 MEQ tablet Take 1 tablet (10 mEq total) by mouth daily. MUST SCHEDULE ANNUAL EXAM FOR Nov 2018 (Patient not taking: Reported on 07/10/2017) 90 tablet 0   No current facility-administered medications for this visit.     Allergies  Allergen Reactions  . Aleve [Naproxen Sodium]     Family History  Problem Relation Age of Onset  . Hypertension Father   . Hypertension Mother   . Cancer Mother        lung    Social History   Socioeconomic History  . Marital status: Married    Spouse name: Not on file  . Number of children: 3  . Years of education: Not on file  . Highest education level: Not on file  Occupational History  . Not on file  Social Needs  . Financial resource strain: Not on file  . Food insecurity:    Worry: Not on  file    Inability: Not on file  . Transportation needs:    Medical: Not on file    Non-medical: Not on file  Tobacco Use  . Smoking status: Never Smoker  . Smokeless tobacco: Never Used  Substance and Sexual Activity  . Alcohol use: No  . Drug use: No  . Sexual activity: Yes    Birth control/protection: None    Comment: divorced; 1 partner in past yr  Lifestyle  . Physical activity:    Days per week: Not on file    Minutes per session: Not on file  . Stress: Not on file  Relationships  . Social connections:    Talks on phone: Not on file    Gets together: Not on file    Attends religious service: Not on file    Active member of club or organization: Not on file    Attends meetings of clubs or organizations: Not on file    Relationship status: Not on file  . Intimate partner violence:    Fear of current or ex partner: Not on file    Emotionally abused: Not on file    Physically abused: Not on file    Forced sexual activity: Not on file  Other Topics Concern  . Not on file  Social History Narrative   Married.   Works to [employer].   3 children.   Enjoys being outdoors in the garden.      Constitutional: Denies fever, malaise, fatigue, headache or abrupt weight changes.  HEENT: Denies eye pain, eye redness, ear pain, ringing in the ears, wax buildup, runny nose, nasal congestion, bloody nose, or sore throat. Respiratory: Denies difficulty breathing, shortness of breath, cough or sputum production.   Cardiovascular: Denies chest pain, chest tightness, palpitations or swelling in the hands or feet.  Gastrointestinal: Denies abdominal pain, bloating, constipation, diarrhea or blood in the stool.  GU: Denies urgency, frequency, pain with urination, burning sensation, blood in urine, odor or discharge. Musculoskeletal: Pt reports muscle cramping in legs. Denies decrease in range of motion, difficulty with gait, or joint pain and swelling.  Skin: Denies redness, rashes,  lesions or ulcercations.  Neurological: Denies dizziness, difficulty with memory, difficulty with speech or problems with balance and coordination.  Psych: Denies anxiety, depression, SI/HI.  No other specific complaints in a complete review of systems (except as listed in HPI above).  Objective:   Physical Exam   BP 128/84   Pulse 71   Temp 98.2 F (36.8 C) (Oral)   Ht 5' 1.5" (1.562 m)   Wt 135 lb (61.2 kg)   SpO2 97%   BMI 25.09 kg/m  Wt Readings from Last 3 Encounters:  07/16/18 135 lb (61.2 kg)  07/10/17 135 lb 8 oz (61.5 kg)  07/04/16 126 lb (57.2 kg)    General: Appears her stated age, well developed, well nourished in NAD. Skin: Warm, dry and intact.  HEENT: Head: normal shape and size; Eyes: sclera white, no icterus, conjunctiva pink, PERRLA and EOMs intact; Ears: Tm's gray and intact, normal light reflex; Throat/Mouth: Teeth present, mucosa pink and moist, no exudate, lesions or ulcerations noted.  Neck:  Neck supple, trachea midline. No masses, lumps or thyromegaly present.  Cardiovascular: Normal rate and rhythm. S1,S2 noted.  No murmur, rubs or gallops noted. No JVD or BLE edema. No carotid bruits noted. Pulmonary/Chest: Normal effort and positive vesicular breath sounds. No respiratory distress. No wheezes, rales or ronchi noted.  Abdomen: Soft and nontender. Normal bowel sounds. No distention or masses noted. Liver, spleen and kidneys non palpable. Musculoskeletal: Strength 5/5 BUE/BLE. No difficulty with gait.  Neurological: Alert and oriented. Cranial nerves II-XII grossly intact. Coordination normal.  Psychiatric: Mood and affect normal. Behavior is normal. Judgment and thought content normal.     BMET    Component Value Date/Time   NA 141 07/10/2017 1229   K 3.5 07/10/2017 1229   CL 105 07/10/2017 1229   CO2 30 07/10/2017 1229   GLUCOSE 92 07/10/2017 1229   BUN 12 07/10/2017 1229   CREATININE 0.61 07/10/2017 1229   CALCIUM 10.3 07/10/2017 1229     Lipid Panel     Component Value Date/Time   CHOL 185 07/10/2017 1229   TRIG 100.0 07/10/2017 1229   HDL 58.40 07/10/2017 1229   CHOLHDL 3 07/10/2017 1229   VLDL 20.0 07/10/2017 1229   LDLCALC 106 (H) 07/10/2017 1229    CBC    Component Value Date/Time   WBC 6.3 07/10/2017 1229   RBC 4.38 07/10/2017 1229   HGB 12.4 07/10/2017 1229   HCT 38.1 07/10/2017 1229   PLT 182.0 07/10/2017 1229   MCV 87.0 07/10/2017 1229   MCHC 32.6 07/10/2017 1229   RDW 14.0 07/10/2017 1229    Hgb A1C Lab Results  Component Value Date   HGBA1C 6.0 07/10/2017           Assessment & Plan:   Preventative Health Maintenance:  Flu shot UTD Tetanus UTD Pap smear UTD Mammogram ordered, she will call to schedule Colon screening UTD Encouraged her to consume a balanced diet and exercise regimen Advised her to see an eye doctor and dentist annually Will check CBC, CMET, Lipid, and Vit D  RTC in 1 year, sooner if needed Webb Silversmith, NP

## 2018-07-17 ENCOUNTER — Other Ambulatory Visit: Payer: Self-pay | Admitting: Family Medicine

## 2018-07-17 DIAGNOSIS — E7800 Pure hypercholesterolemia, unspecified: Secondary | ICD-10-CM

## 2018-07-17 DIAGNOSIS — I10 Essential (primary) hypertension: Secondary | ICD-10-CM

## 2018-07-19 NOTE — Telephone Encounter (Signed)
Baity patient

## 2018-07-20 MED ORDER — HYDROCHLOROTHIAZIDE 25 MG PO TABS: 25.0000 mg | ORAL_TABLET | Freq: Every day | ORAL | 3 refills | Status: DC

## 2018-07-20 MED ORDER — POTASSIUM CHLORIDE ER 10 MEQ PO TBCR: 10.0000 meq | EXTENDED_RELEASE_TABLET | Freq: Every day | ORAL | 3 refills | Status: DC

## 2018-07-20 NOTE — Addendum Note (Signed)
Addended by: Lurlean Nanny on: 07/20/2018 12:13 PM   Modules accepted: Orders

## 2018-07-20 NOTE — Addendum Note (Signed)
Addended by: Lurlean Nanny on: 07/20/2018 12:14 PM   Modules accepted: Orders

## 2019-07-10 ENCOUNTER — Other Ambulatory Visit: Payer: Self-pay | Admitting: Internal Medicine

## 2019-07-10 DIAGNOSIS — E7800 Pure hypercholesterolemia, unspecified: Secondary | ICD-10-CM

## 2019-07-10 DIAGNOSIS — I10 Essential (primary) hypertension: Secondary | ICD-10-CM

## 2019-07-15 ENCOUNTER — Other Ambulatory Visit: Payer: Self-pay

## 2019-07-15 ENCOUNTER — Ambulatory Visit (INDEPENDENT_AMBULATORY_CARE_PROVIDER_SITE_OTHER): Payer: 59 | Admitting: Internal Medicine

## 2019-07-15 ENCOUNTER — Encounter: Payer: Self-pay | Admitting: Internal Medicine

## 2019-07-15 VITALS — BP 128/84 | HR 82 | Temp 98.5°F | Ht 61.5 in | Wt 131.0 lb

## 2019-07-15 DIAGNOSIS — E785 Hyperlipidemia, unspecified: Secondary | ICD-10-CM

## 2019-07-15 DIAGNOSIS — Z0000 Encounter for general adult medical examination without abnormal findings: Secondary | ICD-10-CM

## 2019-07-15 DIAGNOSIS — I10 Essential (primary) hypertension: Secondary | ICD-10-CM

## 2019-07-15 NOTE — Assessment & Plan Note (Signed)
BP is controlled Continue HCTZ 25mg  every morning as prescribed Continue taking Potassium 10 meq as prescribed

## 2019-07-15 NOTE — Progress Notes (Addendum)
Subjective:    Patient ID: Sandra Ball, female    DOB: [birth year], 58 y.o.   MRN: [id number]  HPI  Pt presents to the clinic today for her annual exam. She is also due to follow up chronic conditions.  HTN: Her BP today is. She is taking HCTZ and potassium as prescribed. There is no ECG on file.   HLD: Her last LDL was. She denies myalgias on Simvastatin. She does not consume a low fat diet.  Flu: 06/2018 Tetanus: 07/2017 Pap Smear: 07/2017 Mammogram: 09/2018 Colon Screening: 02/2014 Vision Screening: annually Dentist: annually  Diet: She does eat meat. She consumes fruits and veggies daily. She does eat some fried foods. She drinks mostly water. Exercise: Walking 30 minutes daily.    Review of Systems      Past Medical History:  Diagnosis Date  . Hyperlipidemia   . Hypertension   . Ocular disease    Dr. Reino Bellis    Current Outpatient Medications  Medication Sig Dispense Refill  . atorvastatin (LIPITOR) 40 MG tablet Take 1 tablet by mouth once daily 30 tablet 0  . hydrochlorothiazide (HYDRODIURIL) 25 MG tablet Take 1 tablet by mouth once daily 90 tablet 0  . potassium chloride (K-DUR) 10 MEQ tablet Take 1 tablet (10 mEq total) by mouth daily. 90 tablet 3   No current facility-administered medications for this visit.     Allergies  Allergen Reactions  . Aleve [Naproxen Sodium]     Family History  Problem Relation Age of Onset  . Hypertension Father   . Hypertension Mother   . Cancer Mother        lung    Social History   Socioeconomic History  . Marital status: Married    Spouse name: Not on file  . Number of children: 3  . Years of education: Not on file  . Highest education level: Not on file  Occupational History  . Not on file  Social Needs  . Financial resource strain: Not on file  . Food insecurity    Worry: Not on file    Inability: Not on file  . Transportation needs    Medical: Not on file    Non-medical: Not on file  Tobacco Use   . Smoking status: Never Smoker  . Smokeless tobacco: Never Used  Substance and Sexual Activity  . Alcohol use: No  . Drug use: No  . Sexual activity: Yes    Birth control/protection: None    Comment: divorced; 1 partner in past yr  Lifestyle  . Physical activity    Days per week: Not on file    Minutes per session: Not on file  . Stress: Not on file  Relationships  . Social Herbalist on phone: Not on file    Gets together: Not on file    Attends religious service: Not on file    Active member of club or organization: Not on file    Attends meetings of clubs or organizations: Not on file    Relationship status: Not on file  . Intimate partner violence    Fear of current or ex partner: Not on file    Emotionally abused: Not on file    Physically abused: Not on file    Forced sexual activity: Not on file  Other Topics Concern  . Not on file  Social History Narrative   Married.   Works to [employer].   3 children.   Enjoys  being outdoors in the garden.      Constitutional: Denies fever, malaise, fatigue, headache or abrupt weight changes.  HEENT: Denies eye pain, eye redness, ear pain, ringing in the ears, wax buildup, runny nose, nasal congestion, bloody nose, or sore throat. Respiratory: Denies difficulty breathing, shortness of breath, cough or sputum production.   Cardiovascular: Denies chest pain, chest tightness, palpitations or swelling in the hands or feet.  Gastrointestinal: Denies abdominal pain, bloating, constipation, diarrhea or blood in the stool.  GU: Denies urgency, frequency, pain with urination, burning sensation, blood in urine, odor or discharge. Musculoskeletal: Denies decrease in range of motion, difficulty with gait, muscle pain or joint pain and swelling.  Skin: Denies redness, rashes, lesions or ulcercations.  Neurological: Denies dizziness, difficulty with memory, difficulty with speech or problems with balance and coordination.   Psych: Denies anxiety, depression, SI/HI.  No other specific complaints in a complete review of systems (except as listed in HPI above).  Objective:   Physical Exam   BP 128/84   Pulse 82   Temp 98.5 F (36.9 C) (Temporal)   Ht 5' 1.5" (1.562 m)   Wt 131 lb (59.4 kg)   SpO2 98%   BMI 24.35 kg/m   Wt Readings from Last 3 Encounters:  07/16/18 135 lb (61.2 kg)  07/10/17 135 lb 8 oz (61.5 kg)  07/04/16 126 lb (57.2 kg)    General: Appears her stated age, well developed, well nourished in NAD. Skin: Warm, dry and intact. No rashes noted. HEENT: Head: normal shape and size; Eyes: sclera white, no icterus, conjunctiva pink, PERRLA and EOMs intact; Ears: Tm's gray and intact, normal light reflex; Neck:  Neck supple, trachea midline. No masses, lumps or thyromegaly present.  Cardiovascular: Normal rate and rhythm. S1,S2 noted.  No murmur, rubs or gallops noted. No JVD or BLE edema. No carotid bruits noted. Pulmonary/Chest: Normal effort and positive vesicular breath sounds. No respiratory distress. No wheezes, rales or ronchi noted.  Abdomen: Soft and nontender. Normal bowel sounds. No distention or masses noted. Liver, spleen and kidneys non palpable. Musculoskeletal: Strength 5/5 BUE/BLE. No difficulty with gait.  Neurological: Alert and oriented. Cranial nerves II-XII grossly intact. Coordination normal.  Psychiatric: Mood and affect normal. Behavior is normal. Judgment and thought content normal.     BMET    Component Value Date/Time   NA 141 07/16/2018 1452   K 3.3 (L) 07/16/2018 1452   CL 104 07/16/2018 1452   CO2 28 07/16/2018 1452   GLUCOSE 96 07/16/2018 1452   BUN 11 07/16/2018 1452   CREATININE 0.66 07/16/2018 1452   CALCIUM 10.5 07/16/2018 1452    Lipid Panel     Component Value Date/Time   CHOL 187 07/16/2018 1452   TRIG 132.0 07/16/2018 1452   HDL 55.90 07/16/2018 1452   CHOLHDL 3 07/16/2018 1452   VLDL 26.4 07/16/2018 1452   LDLCALC 105 (H) 07/16/2018  1452    CBC    Component Value Date/Time   WBC 8.2 07/16/2018 1452   RBC 4.50 07/16/2018 1452   HGB 12.6 07/16/2018 1452   HCT 37.9 07/16/2018 1452   PLT 183.0 07/16/2018 1452   MCV 84.2 07/16/2018 1452   MCHC 33.3 07/16/2018 1452   RDW 13.7 07/16/2018 1452    Hgb A1C Lab Results  Component Value Date   HGBA1C 6.0 07/10/2017           Assessment & Plan:   Preventative Health Maintenance:  Flu shot today Tetanus UTD Pap  smear UTD Mammogram UTD Colon Screening UTD Encouraged her to consume a balanced diet and exercise regimen Advised her to see an eye doctor and dentist annually Will check CBC, CMET, Lipid and Vit D today  RTC in 1 year, sooner if needed Webb Silversmith, NP

## 2019-07-15 NOTE — Progress Notes (Signed)
Established Patient Office Visit  Subjective:  Patient ID: Sandra Ball, female    DOB: 20-Mar-1961  Age: 58 y.o. MRN: [id number]    HPI [facility] Creer presents for her annual physical. She is due for follow-up of chronic conditions.   HTN: Her BP today is 128/84. She is taking HCTZ 25mg  every morning along with her Potassium 10 meq as prescribed. There is no ECG on file.  HLD: Her last LDL was 105, 07/2018. She denies myalgias on Simvastatin 40 mg. She tries to consume a low fat diet.  Arthritis?: Controlled on OTC Tylenol PRN.   Flu: 06/2018, will get this year Tetanus: 06/2014 Pap Smear: 07/2017 Mammogram: 09/2018 Colon Screening: 02/2014 Vision Screening: annually Dentist: annually  Diet: She does eat some meat. She consumes some fruits and veggies. She does eat occasionally fried foods. She drinks mostly water.  Exercise: She walks at least 30 min daily.   Past Medical History:  Diagnosis Date  . Hyperlipidemia   . Hypertension   . Ocular disease    Dr. Reino Bellis    Past Surgical History:  Procedure Laterality Date  . BREAST SURGERY    . TUBAL LIGATION      Family History  Problem Relation Age of Onset  . Hypertension Father   . Hypertension Mother   . Cancer Mother        lung    Social History   Socioeconomic History  . Marital status: Married    Spouse name: Not on file  . Number of children: 3  . Years of education: Not on file  . Highest education level: Not on file  Occupational History  . Not on file  Social Needs  . Financial resource strain: Not on file  . Food insecurity    Worry: Not on file    Inability: Not on file  . Transportation needs    Medical: Not on file    Non-medical: Not on file  Tobacco Use  . Smoking status: Never Smoker  . Smokeless tobacco: Never Used  Substance and Sexual Activity  . Alcohol use: No  . Drug use: No  . Sexual activity: Yes    Birth control/protection: None    Comment: divorced; 1 partner in  past yr  Lifestyle  . Physical activity    Days per week: Not on file    Minutes per session: Not on file  . Stress: Not on file  Relationships  . Social Herbalist on phone: Not on file    Gets together: Not on file    Attends religious service: Not on file    Active member of club or organization: Not on file    Attends meetings of clubs or organizations: Not on file    Relationship status: Not on file  . Intimate partner violence    Fear of current or ex partner: Not on file    Emotionally abused: Not on file    Physically abused: Not on file    Forced sexual activity: Not on file  Other Topics Concern  . Not on file  Social History Narrative   Married.   Works to [employer].   3 children.   Enjoys being outdoors in the garden.     Outpatient Medications Prior to Visit  Medication Sig Dispense Refill  . atorvastatin (LIPITOR) 40 MG tablet Take 1 tablet by mouth once daily 30 tablet 0  . hydrochlorothiazide (HYDRODIURIL) 25 MG tablet Take 1  tablet by mouth once daily 90 tablet 0  . potassium chloride (K-DUR) 10 MEQ tablet Take 1 tablet (10 mEq total) by mouth daily. 90 tablet 3   No facility-administered medications prior to visit.     Allergies  Allergen Reactions  . Aleve [Naproxen Sodium]     ROS Review of Systems    Past Medical History:  Diagnosis Date  . Hyperlipidemia   . Hypertension   . Ocular disease    Dr. Reino Bellis    Current Outpatient Medications  Medication Sig Dispense Refill  . atorvastatin (LIPITOR) 40 MG tablet Take 1 tablet by mouth once daily 30 tablet 0  . hydrochlorothiazide (HYDRODIURIL) 25 MG tablet Take 1 tablet by mouth once daily 90 tablet 0  . potassium chloride (K-DUR) 10 MEQ tablet Take 1 tablet (10 mEq total) by mouth daily. 90 tablet 3   No current facility-administered medications for this visit.     Allergies  Allergen Reactions  . Aleve [Naproxen Sodium]     Family History  Problem Relation Age of  Onset  . Hypertension Father   . Hypertension Mother   . Cancer Mother        lung    Social History   Socioeconomic History  . Marital status: Married    Spouse name: Not on file  . Number of children: 3  . Years of education: Not on file  . Highest education level: Not on file  Occupational History  . Not on file  Social Needs  . Financial resource strain: Not on file  . Food insecurity    Worry: Not on file    Inability: Not on file  . Transportation needs    Medical: Not on file    Non-medical: Not on file  Tobacco Use  . Smoking status: Never Smoker  . Smokeless tobacco: Never Used  Substance and Sexual Activity  . Alcohol use: No  . Drug use: No  . Sexual activity: Yes    Birth control/protection: None    Comment: divorced; 1 partner in past yr  Lifestyle  . Physical activity    Days per week: Not on file    Minutes per session: Not on file  . Stress: Not on file  Relationships  . Social Herbalist on phone: Not on file    Gets together: Not on file    Attends religious service: Not on file    Active member of club or organization: Not on file    Attends meetings of clubs or organizations: Not on file    Relationship status: Not on file  . Intimate partner violence    Fear of current or ex partner: Not on file    Emotionally abused: Not on file    Physically abused: Not on file    Forced sexual activity: Not on file  Other Topics Concern  . Not on file  Social History Narrative   Married.   Works to [employer].   3 children.   Enjoys being outdoors in the garden.      Constitutional: Denies fever, malaise, fatigue, headache or abrupt weight changes.  HEENT: Denies eye pain, eye redness, ear pain, ringing in the ears, wax buildup, runny nose, nasal congestion, bloody nose, or sore throat. Respiratory: Denies difficulty breathing, shortness of breath, cough or sputum production.   Cardiovascular: Denies chest pain, chest tightness,  palpitations or swelling in the hands or feet.  Gastrointestinal: Denies abdominal pain, bloating, constipation, diarrhea  or blood in the stool.  GU: Denies urgency, frequency, pain with urination, burning sensation, blood in urine, odor or discharge. Musculoskeletal: Denies decrease in range of motion, difficulty with gait, muscle pain or joint pain and swelling.  Skin: Denies redness, rashes, lesions or ulcercations.  Neurological: Denies dizziness, difficulty with memory, difficulty with speech or problems with balance and coordination.  Psych: Denies anxiety, depression, SI/HI.  No other specific complaints in a complete review of systems (except as listed in HPI above).    Objective:    Physical Exam  There were no vitals taken for this visit. Wt Readings from Last 3 Encounters:  07/16/18 61.2 kg  07/10/17 61.5 kg  07/04/16 57.2 kg     Health Maintenance Due  Topic Date Due  . INFLUENZA VACCINE  04/09/2019    There are no preventive care reminders to display for this patient.  Lab Results  Component Value Date   TSH 3.07 07/10/2017   Lab Results  Component Value Date   WBC 8.2 07/16/2018   HGB 12.6 07/16/2018   HCT 37.9 07/16/2018   MCV 84.2 07/16/2018   PLT 183.0 07/16/2018   Lab Results  Component Value Date   NA 141 07/16/2018   K 3.3 (L) 07/16/2018   CO2 28 07/16/2018   GLUCOSE 96 07/16/2018   BUN 11 07/16/2018   CREATININE 0.66 07/16/2018   BILITOT 0.5 07/16/2018   ALKPHOS 71 07/16/2018   AST 22 07/16/2018   ALT 18 07/16/2018   PROT 7.9 07/16/2018   ALBUMIN 4.5 07/16/2018   CALCIUM 10.5 07/16/2018   GFR 97.87 07/16/2018   Lab Results  Component Value Date   CHOL 187 07/16/2018   Lab Results  Component Value Date   HDL 55.90 07/16/2018   Lab Results  Component Value Date   LDLCALC 105 (H) 07/16/2018   Lab Results  Component Value Date   TRIG 132.0 07/16/2018   Lab Results  Component Value Date   CHOLHDL 3 07/16/2018   Lab Results   Component Value Date   HGBA1C 6.0 07/10/2017   There were no vitals taken for this visit. Wt Readings from Last 3 Encounters:  07/16/18 61.2 kg  07/10/17 61.5 kg  07/04/16 57.2 kg    General: Appears her stated age, well developed, well nourished in NAD. Skin: Warm, dry and intact. No rashes, lesions or ulcerations noted. HEENT: Head: normal shape and size; Eyes: sclera white, no icterus, conjunctiva pink and EOMs intact Neck:  Neck supple, trachea midline. No masses, lumps or thyromegaly present.  Cardiovascular: Normal rate and rhythm. S1,S2 noted.  No murmur, rubs or gallops noted. No JVD or BLE edema. No carotid bruits noted. Pulmonary/Chest: Normal effort and positive vesicular breath sounds. No respiratory distress. No wheezes, rales or ronchi noted.  Abdomen: Soft and nontender. Normal bowel sounds. No distention or masses noted. Liver, spleen and kidneys non palpable. Musculoskeletal: Normal range of motion. No signs of joint swelling. No difficulty with gait.  Neurological: Alert and oriented. Cranial nerves II-XII grossly intact. Coordination normal.  Psychiatric: Mood and affect normal. Behavior is normal. Judgment and thought content normal.   EKG:  BMET    Component Value Date/Time   NA 141 07/16/2018 1452   K 3.3 (L) 07/16/2018 1452   CL 104 07/16/2018 1452   CO2 28 07/16/2018 1452   GLUCOSE 96 07/16/2018 1452   BUN 11 07/16/2018 1452   CREATININE 0.66 07/16/2018 1452   CALCIUM 10.5 07/16/2018 1452  Lipid Panel     Component Value Date/Time   CHOL 187 07/16/2018 1452   TRIG 132.0 07/16/2018 1452   HDL 55.90 07/16/2018 1452   CHOLHDL 3 07/16/2018 1452   VLDL 26.4 07/16/2018 1452   LDLCALC 105 (H) 07/16/2018 1452    CBC    Component Value Date/Time   WBC 8.2 07/16/2018 1452   RBC 4.50 07/16/2018 1452   HGB 12.6 07/16/2018 1452   HCT 37.9 07/16/2018 1452   PLT 183.0 07/16/2018 1452   MCV 84.2 07/16/2018 1452   MCHC 33.3 07/16/2018 1452   RDW  13.7 07/16/2018 1452    Hgb A1C Lab Results  Component Value Date   HGBA1C 6.0 07/10/2017        Assessment & Plan:  Encounter for Preventive Healthcare Maintenance:  Flu shot UTD Tetanus UTD Pap smear UTD Mammogram: UTD Colon screening UTD Encouraged her to consume a balanced diet and exercise regimen Advised her to see an eye doctor and dentist annually Will check CBC, CMET, Lipid, and Vit D  RTC in 1 year, sooner if needed  Problem List Items Addressed This Visit    None      No orders of the defined types were placed in this encounter.   Follow-up: No follow-ups on file.    Leward Quan, Student-PA

## 2019-07-15 NOTE — Patient Instructions (Signed)

## 2019-07-15 NOTE — Assessment & Plan Note (Addendum)
CMET and lipid profile today Continue Simvastatin 40 mg as prescribed Encouraged to consume low-fat diet

## 2019-07-16 LAB — COMPREHENSIVE METABOLIC PANEL
AG Ratio: 1.4 (calc) (ref 1.0–2.5)
ALT: 35 U/L — ABNORMAL HIGH (ref 6–29)
AST: 26 U/L (ref 10–35)
Albumin: 4.6 g/dL (ref 3.6–5.1)
Alkaline phosphatase (APISO): 88 U/L (ref 37–153)
BUN: 12 mg/dL (ref 7–25)
CO2: 24 mmol/L (ref 20–32)
Calcium: 11 mg/dL — ABNORMAL HIGH (ref 8.6–10.4)
Chloride: 103 mmol/L (ref 98–110)
Creat: 0.73 mg/dL (ref 0.50–1.05)
Globulin: 3.2 g/dL (calc) (ref 1.9–3.7)
Glucose, Bld: 92 mg/dL (ref 65–99)
Potassium: 3.7 mmol/L (ref 3.5–5.3)
Sodium: 139 mmol/L (ref 135–146)
Total Bilirubin: 0.7 mg/dL (ref 0.2–1.2)
Total Protein: 7.8 g/dL (ref 6.1–8.1)

## 2019-07-16 LAB — CBC
HCT: 37.1 % (ref 35.0–45.0)
Hemoglobin: 12.5 g/dL (ref 11.7–15.5)
MCH: 28 pg (ref 27.0–33.0)
MCHC: 33.7 g/dL (ref 32.0–36.0)
MCV: 83 fL (ref 80.0–100.0)
MPV: 12 fL (ref 7.5–12.5)
Platelets: 214 10*3/uL (ref 140–400)
RBC: 4.47 10*6/uL (ref 3.80–5.10)
RDW: 12.9 % (ref 11.0–15.0)
WBC: 6.6 10*3/uL (ref 3.8–10.8)

## 2019-07-16 LAB — LIPID PANEL
Cholesterol: 205 mg/dL — ABNORMAL HIGH (ref <200)
HDL: 58 mg/dL (ref >=50)
LDL Cholesterol (Calc): 127 mg/dL (calc) — ABNORMAL HIGH
Non-HDL Cholesterol (Calc): 147 mg/dL (calc) — ABNORMAL HIGH (ref <130)
Total CHOL/HDL Ratio: 3.5 (calc) (ref <5.0)
Triglycerides: 95 mg/dL (ref <150)

## 2019-07-16 LAB — VITAMIN D 25 HYDROXY (VIT D DEFICIENCY, FRACTURES): Vit D, 25-Hydroxy: 27 ng/mL — ABNORMAL LOW (ref 30–100)

## 2019-08-24 ENCOUNTER — Other Ambulatory Visit: Payer: Self-pay | Admitting: Internal Medicine

## 2019-08-24 DIAGNOSIS — E7800 Pure hypercholesterolemia, unspecified: Secondary | ICD-10-CM

## 2019-08-24 DIAGNOSIS — I10 Essential (primary) hypertension: Secondary | ICD-10-CM

## 2019-08-25 MED ORDER — ATORVASTATIN CALCIUM 40 MG PO TABS: 40.0000 mg | ORAL_TABLET | Freq: Every day | ORAL | 2 refills | Status: DC

## 2019-08-25 MED ORDER — HYDROCHLOROTHIAZIDE 25 MG PO TABS: 25.0000 mg | ORAL_TABLET | Freq: Every day | ORAL | 2 refills | Status: DC

## 2019-11-25 ENCOUNTER — Encounter: Payer: Self-pay | Admitting: Internal Medicine

## 2019-11-25 ENCOUNTER — Other Ambulatory Visit: Payer: Self-pay

## 2019-11-25 ENCOUNTER — Ambulatory Visit: Payer: 59 | Admitting: Internal Medicine

## 2019-11-25 VITALS — BP 122/82 | HR 73 | Temp 97.9°F | Wt 134.0 lb

## 2019-11-25 DIAGNOSIS — R5383 Other fatigue: Secondary | ICD-10-CM

## 2019-11-25 DIAGNOSIS — K5900 Constipation, unspecified: Secondary | ICD-10-CM | POA: Diagnosis not present

## 2019-11-25 DIAGNOSIS — R1012 Left upper quadrant pain: Secondary | ICD-10-CM | POA: Diagnosis not present

## 2019-11-25 DIAGNOSIS — R232 Flushing: Secondary | ICD-10-CM

## 2019-11-25 DIAGNOSIS — R002 Palpitations: Secondary | ICD-10-CM | POA: Diagnosis not present

## 2019-11-25 NOTE — Patient Instructions (Signed)
Palpitations Palpitations are feelings that your heartbeat is not normal. Your heartbeat may feel like it is:  Uneven.  Faster than normal.  Fluttering.  Skipping a beat. This is usually not a serious problem. In some cases, you may need tests to rule out any serious problems. Follow these instructions at home: Pay attention to any changes in your condition. Take these actions to help manage your symptoms: Eating and drinking  Avoid: ? Coffee, tea, soft drinks, and energy drinks. ? Chocolate. ? Alcohol. ? Diet pills. Lifestyle   Try to lower your stress. These things can help you relax: ? Yoga. ? Deep breathing and meditation. ? Exercise. ? Using words and images to create positive thoughts (guided imagery). ? Using your mind to control things in your body (biofeedback).  Do not use drugs.  Get plenty of rest and sleep. Keep a regular bed time. General instructions   Take over-the-counter and prescription medicines only as told by your doctor.  Do not use any products that contain nicotine or tobacco, such as cigarettes and e-cigarettes. If you need help quitting, ask your doctor.  Keep all follow-up visits as told by your doctor. This is important. You may need more tests if palpitations do not go away or get worse. Contact a doctor if:  Your symptoms last more than 24 hours.  Your symptoms occur more often. Get help right away if you:  Have chest pain.  Feel short of breath.  Have a very bad headache.  Feel dizzy.  Pass out (faint). Summary  Palpitations are feelings that your heartbeat is uneven or faster than normal. It may feel like your heart is fluttering or skipping a beat.  Avoid food and drinks that may cause palpitations. These include caffeine, chocolate, and alcohol.  Try to lower your stress. Do not smoke or use drugs.  Get help right away if you faint or have chest pain, shortness of breath, a severe headache, or dizziness. This  information is not intended to replace advice given to you by your health care provider. Make sure you discuss any questions you have with your health care provider. Document Revised: 10/07/2017 Document Reviewed: 10/07/2017 Elsevier Patient Education  2020 [employer]  

## 2019-11-25 NOTE — Progress Notes (Signed)
Subjective:    Patient ID: Sandra Ball, female    DOB: Oct 06, 1960, 59 y.o.   MRN: [id number]  HPI  Pt presents to the clinic today with c/o fatigue, hot flashes, intermittent palpitations and LUQ pain. These symptoms started a few weeks ago. She reports she does not sleep well. 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 She reports she may sleep about 4-5 hours interrupted at night. She does not nap during the day. She reports when she gets worried about something she will get the hot flashes from her toes up to her face and notices the palpitations. She denies chest pain or SOB. She has some intermittent reflux but not severe. She reports intermittent constipation but no blood in her stool. She denies nausea, vomiting, diarrhea or blood in her stool.  Review of Systems  Past Medical History:  Diagnosis Date  . Hyperlipidemia   . Hypertension   . Ocular disease    Dr. Reino Bellis    Current Outpatient Medications  Medication Sig Dispense Refill  . atorvastatin (LIPITOR) 40 MG tablet Take 1 tablet (40 mg total) by mouth daily. 90 tablet 2  . CINNAMON PO Take by mouth.    . hydrochlorothiazide (HYDRODIURIL) 25 MG tablet Take 1 tablet (25 mg total) by mouth daily. 90 tablet 2  . potassium chloride (K-DUR) 10 MEQ tablet Take 1 tablet (10 mEq total) by mouth daily. 90 tablet 3   No current facility-administered medications for this visit.    Allergies  Allergen Reactions  . Aleve [Naproxen Sodium]     Family History  Problem Relation Age of Onset  . Hypertension Father   . Hypertension Mother   . Cancer Mother        lung    Social History   Socioeconomic History  . Marital status: Married    Spouse name: Not on file  . Number of children: 3  . Years of education: Not on file  . Highest education level: Not on file  Occupational History  . Not on file  Tobacco Use  . Smoking status: Never Smoker  . Smokeless tobacco: Never Used  Substance and Sexual  Activity  . Alcohol use: No  . Drug use: No  . Sexual activity: Yes    Birth control/protection: None    Comment: divorced; 1 partner in past yr  Other Topics Concern  . Not on file  Social History Narrative   Married.   Works to [employer].   3 children.   Enjoys being outdoors in the garden.    Social Determinants of Health   Financial Resource Strain:   . Difficulty of Paying Living Expenses:   Food Insecurity:   . Worried About Charity fundraiser in the Last Year:   . Arboriculturist in the Last Year:   Transportation Needs:   . Film/video editor (Medical):   Marland Kitchen Lack of Transportation (Non-Medical):   Physical Activity:   . Days of Exercise per Week:   . Minutes of Exercise per Session:   Stress:   . Feeling of Stress :   Social Connections:   . Frequency of Communication with Friends and Family:   . Frequency of Social Gatherings with Friends and Family:   . Attends Religious Services:   . Active Member of Clubs or Organizations:   . Attends Archivist Meetings:   Marland Kitchen Marital Status:   Intimate Partner Violence:   . Fear of  Current or Ex-Partner:   . Emotionally Abused:   Marland Kitchen Physically Abused:   . Sexually Abused:      Constitutional: Pt reports fatigue. Denies fever, malaise, headache or abrupt weight changes.  HEENT: Denies eye pain, eye redness, ear pain, ringing in the ears, wax buildup, runny nose, nasal congestion, bloody nose, or sore throat. Respiratory: Denies difficulty breathing, shortness of breath, cough or sputum production.   Cardiovascular: Pt reports palpitations. Denies chest pain, chest tightness, or swelling in the hands or feet.  Gastrointestinal: Pt reports LUQ pain, constipation. Denies abdominal pain, bloating, diarrhea or blood in the stool.  GU: Denies urgency, frequency, pain with urination, burning sensation, blood in urine, odor or discharge. Musculoskeletal: Denies decrease in range of motion, difficulty with gait,  muscle pain or joint pain and swelling.  Skin: Denies redness, rashes, lesions or ulcercations.  Neurological: Denies dizziness, difficulty with memory, difficulty with speech or problems with balance and coordination.  Psych: Pt reports stress. Denies anxiety, depression, SI/HI.  No other specific complaints in a complete review of systems (except as listed in HPI above).     Objective:   Physical Exam   BP 122/82   Pulse 73   Temp 97.9 F (36.6 C) (Temporal)   Wt 134 lb (60.8 kg)   SpO2 98%   BMI 24.91 kg/m  Wt Readings from Last 3 Encounters:  11/25/19 134 lb (60.8 kg)  07/15/19 131 lb (59.4 kg)  07/16/18 135 lb (61.2 kg)    General: Appears her stated age, well developed, well nourished in NAD. Skin: Warm, dry and intact. No rashes noted. HEENT: Head: normal shape and size; Eyes: sclera white, no icterus, conjunctiva pink, PERRLA and EOMs intact;  Neck:  Neck supple, trachea midline. No masses, lumps present. Thyromegaly noted. Cardiovascular: Normal rate and rhythm. S1,S2 noted.  No murmur, rubs or gallops noted. No JVD or BLE edema. No carotid bruits noted. Pulmonary/Chest: Normal effort and positive vesicular breath sounds. No respiratory distress. No wheezes, rales or ronchi noted.  Abdomen: Soft and mildly tender in the LUQ. Normal bowel sounds. No distention or masses noted. Liver, spleen and kidneys non palpable. Musculoskeletal:No difficulty with gait.  Neurological: Alert and oriented.  Psychiatric: Mood and affect normal. Behavior is normal. Judgment and thought content normal.    BMET    Component Value Date/Time   NA 139 07/15/2019 1533   K 3.7 07/15/2019 1533   CL 103 07/15/2019 1533   CO2 24 07/15/2019 1533   GLUCOSE 92 07/15/2019 1533   BUN 12 07/15/2019 1533   CREATININE 0.73 07/15/2019 1533   CALCIUM 11.0 (H) 07/15/2019 1533    Lipid Panel     Component Value Date/Time   CHOL 205 (H) 07/15/2019 1533   TRIG 95 07/15/2019 1533   HDL 58  07/15/2019 1533   CHOLHDL 3.5 07/15/2019 1533   VLDL 26.4 07/16/2018 1452   LDLCALC 127 (H) 07/15/2019 1533    CBC    Component Value Date/Time   WBC 6.6 07/15/2019 1533   RBC 4.47 07/15/2019 1533   HGB 12.5 07/15/2019 1533   HCT 37.1 07/15/2019 1533   PLT 214 07/15/2019 1533   MCV 83.0 07/15/2019 1533   MCH 28.0 07/15/2019 1533   MCHC 33.7 07/15/2019 1533   RDW 12.9 07/15/2019 1533    Hgb A1C Lab Results  Component Value Date   HGBA1C 6.0 07/10/2017           Assessment & Plan:   Fatigue, Hot  Flashes:  Will check CBC, TSH, Vit D and B12  LUQ Pain, Constipation:  Will check CMET, Amylase and Lipase  Palpitations:  Indication for ECG: palpitations Interpretation of ECG: normal rate and rhythm, no acute findings Comparison: None Will check CBC, CMET, TSH  Will follow up after labs, return precautions discussed Webb Silversmith, NP This visit occurred during the SARS-CoV-2 public health emergency.  Safety protocols were in place, including screening questions prior to the visit, additional usage of staff PPE, and extensive cleaning of exam room while observing appropriate contact time as indicated for disinfecting solutions.

## 2019-11-26 LAB — COMPREHENSIVE METABOLIC PANEL
AG Ratio: 1.6 (calc) (ref 1.0–2.5)
ALT: 37 U/L — ABNORMAL HIGH (ref 6–29)
AST: 35 U/L (ref 10–35)
Albumin: 4.4 g/dL (ref 3.6–5.1)
Alkaline phosphatase (APISO): 89 U/L (ref 37–153)
BUN: 18 mg/dL (ref 7–25)
CO2: 25 mmol/L (ref 20–32)
Calcium: 9.9 mg/dL (ref 8.6–10.4)
Chloride: 105 mmol/L (ref 98–110)
Creat: 0.75 mg/dL (ref 0.50–1.05)
Globulin: 2.7 g/dL (calc) (ref 1.9–3.7)
Glucose, Bld: 95 mg/dL (ref 65–99)
Potassium: 3.4 mmol/L — ABNORMAL LOW (ref 3.5–5.3)
Sodium: 140 mmol/L (ref 135–146)
Total Bilirubin: 0.3 mg/dL (ref 0.2–1.2)
Total Protein: 7.1 g/dL (ref 6.1–8.1)

## 2019-11-26 LAB — CBC
HCT: 36.6 % (ref 35.0–45.0)
Hemoglobin: 12.4 g/dL (ref 11.7–15.5)
MCH: 28.6 pg (ref 27.0–33.0)
MCHC: 33.9 g/dL (ref 32.0–36.0)
MCV: 84.3 fL (ref 80.0–100.0)
MPV: 12.2 fL (ref 7.5–12.5)
Platelets: 204 10*3/uL (ref 140–400)
RBC: 4.34 10*6/uL (ref 3.80–5.10)
RDW: 12.9 % (ref 11.0–15.0)
WBC: 6.7 10*3/uL (ref 3.8–10.8)

## 2019-11-26 LAB — VITAMIN B12: Vitamin B-12: 722 pg/mL (ref 200–1100)

## 2019-11-26 LAB — LIPASE: Lipase: 59 U/L (ref 7–60)

## 2019-11-26 LAB — TSH: TSH: 2.54 mIU/L (ref 0.40–4.50)

## 2019-11-26 LAB — VITAMIN D 25 HYDROXY (VIT D DEFICIENCY, FRACTURES): Vit D, 25-Hydroxy: 27 ng/mL — ABNORMAL LOW (ref 30–100)

## 2019-11-26 LAB — AMYLASE: Amylase: 58 U/L (ref 21–101)

## 2019-12-01 ENCOUNTER — Telehealth: Payer: Self-pay

## 2019-12-01 MED ORDER — TRAZODONE HCL 50 MG PO TABS: 50.0000 mg | ORAL_TABLET | Freq: Every evening | ORAL | 2 refills | Status: DC | PRN

## 2019-12-01 NOTE — Addendum Note (Signed)
Addended by: Lurlean Nanny on: 12/01/2019 04:49 PM   Modules accepted: Orders

## 2019-12-01 NOTE — Telephone Encounter (Signed)
We can try Trazadone 50 mg QHS prn. If agreeable, send in #30, 2 refills.

## 2019-12-01 NOTE — Telephone Encounter (Signed)
Rx sent through e-scribe Pt's daughter Vallarie Mare is aware

## 2019-12-01 NOTE — Telephone Encounter (Signed)
Pt reports she is having problems falling and staying asleep... pt has tried OTC Melatonin 5mg  and 10mg  with no rellief... pt would like to know if there is something you may suggest...Marland Kitchen please advise

## 2020-02-10 ENCOUNTER — Ambulatory Visit: Payer: 59 | Admitting: Internal Medicine

## 2020-02-10 ENCOUNTER — Other Ambulatory Visit: Payer: Self-pay

## 2020-02-10 ENCOUNTER — Encounter: Payer: Self-pay | Admitting: Internal Medicine

## 2020-02-10 VITALS — BP 126/84 | HR 84 | Temp 97.7°F | Wt 137.0 lb

## 2020-02-10 DIAGNOSIS — F439 Reaction to severe stress, unspecified: Secondary | ICD-10-CM | POA: Diagnosis not present

## 2020-02-10 DIAGNOSIS — R232 Flushing: Secondary | ICD-10-CM | POA: Diagnosis not present

## 2020-02-10 DIAGNOSIS — R002 Palpitations: Secondary | ICD-10-CM | POA: Diagnosis not present

## 2020-02-10 DIAGNOSIS — F5104 Psychophysiologic insomnia: Secondary | ICD-10-CM | POA: Diagnosis not present

## 2020-02-10 MED ORDER — VENLAFAXINE HCL ER 37.5 MG PO CP24: 37.5000 mg | ORAL_CAPSULE | Freq: Every day | ORAL | 1 refills | Status: DC

## 2020-02-10 NOTE — Progress Notes (Signed)
Subjective:    Patient ID: Sandra Ball, female    DOB: 1960/10/14, 59 y.o.   MRN: [id number]  HPI  Patient presents the clinic today to follow-up palpitations. She has had a few episodes over the last month. When the palpitations occur, she has some chest tightness and burning sensation in her chest. She denies dizziness, chest pain, SOB, nausea or vomiting. She does reports some stress at home, insomnia and hot flashes. She has tried [employer] with minimal relief. She was seen 11/25/2019 for the same.  ECG was normal at that time.  Lab work was unremarkable except for slightly low vitamin D and mild hypokalemia.  Review of Systems      Past Medical History:  Diagnosis Date  . Hyperlipidemia   . Hypertension   . Ocular disease    Dr. Reino Bellis    Current Outpatient Medications  Medication Sig Dispense Refill  . atorvastatin (LIPITOR) 40 MG tablet Take 1 tablet (40 mg total) by mouth daily. 90 tablet 2  . CINNAMON PO Take by mouth.    . hydrochlorothiazide (HYDRODIURIL) 25 MG tablet Take 1 tablet (25 mg total) by mouth daily. 90 tablet 2  . potassium chloride (K-DUR) 10 MEQ tablet Take 1 tablet (10 mEq total) by mouth daily. 90 tablet 3  . traZODone (DESYREL) 50 MG tablet Take 1 tablet (50 mg total) by mouth at bedtime as needed for sleep. 30 tablet 2   No current facility-administered medications for this visit.    Allergies  Allergen Reactions  . Aleve [Naproxen Sodium]     Family History  Problem Relation Age of Onset  . Hypertension Father   . Hypertension Mother   . Cancer Mother        lung    Social History   Socioeconomic History  . Marital status: Married    Spouse name: Not on file  . Number of children: 3  . Years of education: Not on file  . Highest education level: Not on file  Occupational History  . Not on file  Tobacco Use  . Smoking status: Never Smoker  . Smokeless tobacco: Never Used  Substance and Sexual Activity  . Alcohol use: No    . Drug use: No  . Sexual activity: Yes    Birth control/protection: None    Comment: divorced; 1 partner in past yr  Other Topics Concern  . Not on file  Social History Narrative   Married.   Works to [employer].   3 children.   Enjoys being outdoors in the garden.    Social Determinants of Health   Financial Resource Strain:   . Difficulty of Paying Living Expenses:   Food Insecurity:   . Worried About Charity fundraiser in the Last Year:   . Arboriculturist in the Last Year:   Transportation Needs:   . Film/video editor (Medical):   Marland Kitchen Lack of Transportation (Non-Medical):   Physical Activity:   . Days of Exercise per Week:   . Minutes of Exercise per Session:   Stress:   . Feeling of Stress :   Social Connections:   . Frequency of Communication with Friends and Family:   . Frequency of Social Gatherings with Friends and Family:   . Attends Religious Services:   . Active Member of Clubs or Organizations:   . Attends Archivist Meetings:   Marland Kitchen Marital Status:   Intimate Partner Violence:   .  Fear of Current or Ex-Partner:   . Emotionally Abused:   Marland Kitchen Physically Abused:   . Sexually Abused:      Constitutional: Denies fever, malaise, fatigue, headache or abrupt weight changes.  Respiratory: Denies difficulty breathing, shortness of breath, cough or sputum production.   Cardiovascular: Pt reports palpitations, chest tightness. Denies chest pain, or swelling in the hands or feet.  Gastrointestinal: Denies abdominal pain, bloating, constipation, diarrhea or blood in the stool.   No other specific complaints in a complete review of systems (except as listed in HPI above).  Objective:   Physical Exam  BP 126/84   Pulse 84   Temp 97.7 F (36.5 C) (Temporal)   Wt 137 lb (62.1 kg)   SpO2 98%   BMI 25.47 kg/m   Wt Readings from Last 3 Encounters:  11/25/19 134 lb (60.8 kg)  07/15/19 131 lb (59.4 kg)  07/16/18 135 lb (61.2 kg)    General:  Appears her stated age, well developed, well nourished in NAD. Neck:  Neck supple, trachea midline. No masses, lumps or thyromegaly present.  Cardiovascular: Normal rate and rhythm. S1,S2 noted.  No murmur, rubs or gallops noted.  Pulmonary/Chest: Normal effort and positive vesicular breath sounds. No respiratory distress. No wheezes, rales or ronchi noted.  Neurological: Alert and oriented.   BMET    Component Value Date/Time   NA 140 11/25/2019 1608   K 3.4 (L) 11/25/2019 1608   CL 105 11/25/2019 1608   CO2 25 11/25/2019 1608   GLUCOSE 95 11/25/2019 1608   BUN 18 11/25/2019 1608   CREATININE 0.75 11/25/2019 1608   CALCIUM 9.9 11/25/2019 1608    Lipid Panel     Component Value Date/Time   CHOL 205 (H) 07/15/2019 1533   TRIG 95 07/15/2019 1533   HDL 58 07/15/2019 1533   CHOLHDL 3.5 07/15/2019 1533   VLDL 26.4 07/16/2018 1452   LDLCALC 127 (H) 07/15/2019 1533    CBC    Component Value Date/Time   WBC 6.7 11/25/2019 1608   RBC 4.34 11/25/2019 1608   HGB 12.4 11/25/2019 1608   HCT 36.6 11/25/2019 1608   PLT 204 11/25/2019 1608   MCV 84.3 11/25/2019 1608   MCH 28.6 11/25/2019 1608   MCHC 33.9 11/25/2019 1608   RDW 12.9 11/25/2019 1608    Hgb A1C Lab Results  Component Value Date   HGBA1C 6.0 07/10/2017            Assessment & Plan:   Palpitations, Hot Flashes, Stress, Insomnia:  Will check Mg, Potassium, FSH, LH RX for Effexor 37.5 mg PO daily If no improvement, consider treatment with beta blocker vs referral to cardiology for further evaluation.  Will follow up after labs, return precautions discussed Webb Silversmith, NP This visit occurred during the SARS-CoV-2 public health emergency.  Safety protocols were in place, including screening questions prior to the visit, additional usage of staff PPE, and extensive cleaning of exam room while observing appropriate contact time as indicated for disinfecting solutions.

## 2020-02-10 NOTE — Patient Instructions (Signed)
Palpitations Palpitations are feelings that your heartbeat is not normal. Your heartbeat may feel like it is:  Uneven.  Faster than normal.  Fluttering.  Skipping a beat. This is usually not a serious problem. In some cases, you may need tests to rule out any serious problems. Follow these instructions at home: Pay attention to any changes in your condition. Take these actions to help manage your symptoms: Eating and drinking  Avoid: ? Coffee, tea, soft drinks, and energy drinks. ? Chocolate. ? Alcohol. ? Diet pills. Lifestyle   Try to lower your stress. These things can help you relax: ? Yoga. ? Deep breathing and meditation. ? Exercise. ? Using words and images to create positive thoughts (guided imagery). ? Using your mind to control things in your body (biofeedback).  Do not use drugs.  Get plenty of rest and sleep. Keep a regular bed time. General instructions   Take over-the-counter and prescription medicines only as told by your doctor.  Do not use any products that contain nicotine or tobacco, such as cigarettes and e-cigarettes. If you need help quitting, ask your doctor.  Keep all follow-up visits as told by your doctor. This is important. You may need more tests if palpitations do not go away or get worse. Contact a doctor if:  Your symptoms last more than 24 hours.  Your symptoms occur more often. Get help right away if you:  Have chest pain.  Feel short of breath.  Have a very bad headache.  Feel dizzy.  Pass out (faint). Summary  Palpitations are feelings that your heartbeat is uneven or faster than normal. It may feel like your heart is fluttering or skipping a beat.  Avoid food and drinks that may cause palpitations. These include caffeine, chocolate, and alcohol.  Try to lower your stress. Do not smoke or use drugs.  Get help right away if you faint or have chest pain, shortness of breath, a severe headache, or dizziness. This  information is not intended to replace advice given to you by your health care provider. Make sure you discuss any questions you have with your health care provider. Document Revised: 10/07/2017 Document Reviewed: 10/07/2017 Elsevier Patient Education  2020 [employer]  

## 2020-02-11 LAB — POTASSIUM: Potassium: 3.5 mmol/L (ref 3.5–5.3)

## 2020-02-11 LAB — FOLLICLE STIMULATING HORMONE: FSH: 50.8 m[IU]/mL

## 2020-02-11 LAB — LUTEINIZING HORMONE: LH: 15.4 m[IU]/mL

## 2020-02-11 LAB — MAGNESIUM: Magnesium: 2 mg/dL (ref 1.5–2.5)

## 2020-03-18 ENCOUNTER — Encounter: Payer: Self-pay | Admitting: Internal Medicine

## 2020-03-29 MED ORDER — POTASSIUM CHLORIDE ER 10 MEQ PO TBCR: 10.0000 meq | EXTENDED_RELEASE_TABLET | Freq: Every day | ORAL | 3 refills | Status: DC

## 2020-03-29 MED ORDER — VENLAFAXINE HCL ER 75 MG PO CP24
75.0000 mg | ORAL_CAPSULE | Freq: Every day | ORAL | 3 refills | Status: DC
Start: 2020-03-29 — End: 2021-04-24

## 2020-03-29 MED ORDER — TRAZODONE HCL 50 MG PO TABS: 50.0000 mg | ORAL_TABLET | Freq: Every evening | ORAL | 2 refills | Status: DC | PRN

## 2020-05-17 ENCOUNTER — Encounter: Payer: Self-pay | Admitting: Internal Medicine

## 2020-05-17 DIAGNOSIS — I10 Essential (primary) hypertension: Secondary | ICD-10-CM

## 2020-05-18 MED ORDER — ATORVASTATIN CALCIUM 40 MG PO TABS: 40.0000 mg | ORAL_TABLET | Freq: Every day | ORAL | 0 refills | Status: DC

## 2020-06-14 ENCOUNTER — Encounter: Payer: Self-pay | Admitting: Internal Medicine

## 2020-06-14 DIAGNOSIS — E7800 Pure hypercholesterolemia, unspecified: Secondary | ICD-10-CM

## 2020-06-14 MED ORDER — HYDROCHLOROTHIAZIDE 25 MG PO TABS: 25.0000 mg | ORAL_TABLET | Freq: Every day | ORAL | 0 refills | Status: DC

## 2020-07-13 ENCOUNTER — Other Ambulatory Visit: Payer: Self-pay

## 2020-07-13 ENCOUNTER — Encounter: Payer: Self-pay | Admitting: Internal Medicine

## 2020-07-13 ENCOUNTER — Ambulatory Visit (INDEPENDENT_AMBULATORY_CARE_PROVIDER_SITE_OTHER): Payer: 59 | Admitting: Internal Medicine

## 2020-07-13 VITALS — BP 140/90 | HR 84 | Temp 98.1°F | Ht 61.5 in | Wt 137.8 lb

## 2020-07-13 DIAGNOSIS — E785 Hyperlipidemia, unspecified: Secondary | ICD-10-CM | POA: Diagnosis not present

## 2020-07-13 DIAGNOSIS — Z0000 Encounter for general adult medical examination without abnormal findings: Secondary | ICD-10-CM | POA: Diagnosis not present

## 2020-07-13 DIAGNOSIS — Z23 Encounter for immunization: Secondary | ICD-10-CM

## 2020-07-13 DIAGNOSIS — I10 Essential (primary) hypertension: Secondary | ICD-10-CM | POA: Diagnosis not present

## 2020-07-13 DIAGNOSIS — Z780 Asymptomatic menopausal state: Secondary | ICD-10-CM

## 2020-07-13 DIAGNOSIS — N951 Menopausal and female climacteric states: Secondary | ICD-10-CM | POA: Diagnosis not present

## 2020-07-13 MED ORDER — TRAZODONE HCL 50 MG PO TABS: 50.0000 mg | ORAL_TABLET | Freq: Every evening | ORAL | 2 refills | Status: DC | PRN

## 2020-07-13 NOTE — Progress Notes (Signed)
Subjective:    Patient ID: Sandra Ball, female    DOB: 09-01-1961, 59 y.o.   MRN: [id number]  HPI  Pt presents to the clinic today for her annual exam. She is also due to follow up chronic conditions.  HTN: Her BP today is 140/90. She is taking HCTZ as prescribed. ECG from 11/2019 reviewed.  HLD: Her last LDL was 127, 07/2019. She denies myalgias on Atorvastatin. She does not consume a low fat diet.  Menopausal Symptoms: Mainly stress, hot flashes and insomnia. This is managed with Venlafaxine and Trazadone with good relief of symptoms.   Flu: 06/2018 Tetanus: 06/2014 Covid: Pfizer Pap Smear: 07/2017 Mammogram: 09/2018 Bone Density: never Colon Screening: ?2015 Vision Screening: annually Dentist: as needed  Diet: She does eat meat. She does eat fruits and veggies daily. She tries to avoid fried foods. She drinks mostly coffee, water. Exercise: Walking   Review of Systems      Past Medical History:  Diagnosis Date  . Hyperlipidemia   . Hypertension   . Ocular disease    Dr. Reino Bellis    Current Outpatient Medications  Medication Sig Dispense Refill  . atorvastatin (LIPITOR) 40 MG tablet Take 1 tablet (40 mg total) by mouth daily. 90 tablet 0  . CINNAMON PO Take by mouth.    . hydrochlorothiazide (HYDRODIURIL) 25 MG tablet Take 1 tablet (25 mg total) by mouth daily. 90 tablet 0  . potassium chloride (KLOR-CON) 10 MEQ tablet Take 1 tablet (10 mEq total) by mouth daily. 90 tablet 3  . traZODone (DESYREL) 50 MG tablet Take 1 tablet (50 mg total) by mouth at bedtime as needed for sleep. 30 tablet 2  . venlafaxine XR (EFFEXOR XR) 75 MG 24 hr capsule Take 1 capsule (75 mg total) by mouth daily with breakfast. 90 capsule 3   No current facility-administered medications for this visit.    Allergies  Allergen Reactions  . Aleve [Naproxen Sodium]     Family History  Problem Relation Age of Onset  . Hypertension Father   . Hypertension Mother   . Cancer Mother         lung    Social History   Socioeconomic History  . Marital status: Married    Spouse name: Not on file  . Number of children: 3  . Years of education: Not on file  . Highest education level: Not on file  Occupational History  . Not on file  Tobacco Use  . Smoking status: Never Smoker  . Smokeless tobacco: Never Used  Substance and Sexual Activity  . Alcohol use: No  . Drug use: No  . Sexual activity: Yes    Birth control/protection: None    Comment: divorced; 1 partner in past yr  Other Topics Concern  . Not on file  Social History Narrative   Married.   Works to [employer].   3 children.   Enjoys being outdoors in the garden.    Social Determinants of Health   Financial Resource Strain:   . Difficulty of Paying Living Expenses: Not on file  Food Insecurity:   . Worried About Charity fundraiser in the Last Year: Not on file  . Ran Out of Food in the Last Year: Not on file  Transportation Needs:   . Lack of Transportation (Medical): Not on file  . Lack of Transportation (Non-Medical): Not on file  Physical Activity:   . Days of Exercise per Week: Not on file  .  Minutes of Exercise per Session: Not on file  Stress:   . Feeling of Stress : Not on file  Social Connections:   . Frequency of Communication with Friends and Family: Not on file  . Frequency of Social Gatherings with Friends and Family: Not on file  . Attends Religious Services: Not on file  . Active Member of Clubs or Organizations: Not on file  . Attends Archivist Meetings: Not on file  . Marital Status: Not on file  Intimate Partner Violence:   . Fear of Current or Ex-Partner: Not on file  . Emotionally Abused: Not on file  . Physically Abused: Not on file  . Sexually Abused: Not on file     Constitutional: Denies fever, malaise, fatigue, headache or abrupt weight changes.  HEENT: Denies eye pain, eye redness, ear pain, ringing in the ears, wax buildup, runny nose, nasal  congestion, bloody nose, or sore throat. Respiratory: Denies difficulty breathing, shortness of breath, cough or sputum production.   Cardiovascular: Denies chest pain, chest tightness, palpitations or swelling in the hands or feet.  Gastrointestinal: Denies abdominal pain, bloating, constipation, diarrhea or blood in the stool.  GU: Denies urgency, frequency, pain with urination, burning sensation, blood in urine, odor or discharge. Musculoskeletal: Denies decrease in range of motion, difficulty with gait, muscle pain or joint pain and swelling.  Skin: Denies redness, rashes, lesions or ulcercations.  Neurological: Pt reports hot flashes and insomnia. Denies dizziness, difficulty with memory, difficulty with speech or problems with balance and coordination.  Psych: Pt reports stress. Denies anxiety, depression, SI/HI.  No other specific complaints in a complete review of systems (except as listed in HPI above).  Objective:   Physical Exam  BP 140/90   Pulse 84   Temp 98.1 F (36.7 C) (Temporal)   Ht 5' 1.5" (1.562 m)   Wt 137 lb 12 oz (62.5 kg)   SpO2 98%   BMI 25.61 kg/m   Wt Readings from Last 3 Encounters:  02/10/20 137 lb (62.1 kg)  11/25/19 134 lb (60.8 kg)  07/15/19 131 lb (59.4 kg)    General: Appears her stated age, well developed, well nourished in NAD. Skin: Warm, dry and intact. No rashes,noted. HEENT: Head: normal shape and size; Eyes: sclera white, no icterus, conjunctiva pink, PERRLA and EOMs intact;  Neck:  Neck supple, trachea midline. No masses, lumps or thyromegaly present.  Cardiovascular: Normal rate and rhythm. S1,S2 noted.  No murmur, rubs or gallops noted. No JVD or BLE edema. No carotid bruits noted. Pulmonary/Chest: Normal effort and positive vesicular breath sounds. No respiratory distress. No wheezes, rales or ronchi noted.  Abdomen: Soft and nontender. Normal bowel sounds. No distention or masses noted. Liver, spleen and kidneys non  palpable. Musculoskeletal: Joint enlargement noted in hands. Strength 5/5 BUE/BLE. No difficulty with gait.  Neurological: Alert and oriented. Cranial nerves II-XII grossly intact. Coordination normal.  Psychiatric: Mood and affect normal. Behavior is normal. Judgment and thought content normal.    BMET    Component Value Date/Time   NA 140 11/25/2019 1608   K 3.5 02/10/2020 1506   CL 105 11/25/2019 1608   CO2 25 11/25/2019 1608   GLUCOSE 95 11/25/2019 1608   BUN 18 11/25/2019 1608   CREATININE 0.75 11/25/2019 1608   CALCIUM 9.9 11/25/2019 1608    Lipid Panel     Component Value Date/Time   CHOL 205 (H) 07/15/2019 1533   TRIG 95 07/15/2019 1533   HDL 58 07/15/2019  1533   CHOLHDL 3.5 07/15/2019 1533   VLDL 26.4 07/16/2018 1452   LDLCALC 127 (H) 07/15/2019 1533    CBC    Component Value Date/Time   WBC 6.7 11/25/2019 1608   RBC 4.34 11/25/2019 1608   HGB 12.4 11/25/2019 1608   HCT 36.6 11/25/2019 1608   PLT 204 11/25/2019 1608   MCV 84.3 11/25/2019 1608   MCH 28.6 11/25/2019 1608   MCHC 33.9 11/25/2019 1608   RDW 12.9 11/25/2019 1608    Hgb A1C Lab Results  Component Value Date   HGBA1C 6.0 07/10/2017           Assessment & Plan:   Preventative Health Maintenance:  Flu shot today Tetanus UTD Covid UTD- she will let me know exact dates Pap smear UTD Mammogram due, she will call to schedule Bone density ordered, she will schedule with mammogram Colon screening UTD per her report Encouraged her to consume a balanced diet and exercise regimen Advised her to see an eye doctor and dentist annually Will check CBC, CMET, Lipid and Vit D today  RTC in 1 year, sooner if needed Webb Silversmith, NP This visit occurred during the SARS-CoV-2 public health emergency.  Safety protocols were in place, including screening questions prior to the visit, additional usage of staff PPE, and extensive cleaning of exam room while observing appropriate contact time as  indicated for disinfecting solutions.

## 2020-07-13 NOTE — Patient Instructions (Signed)

## 2020-07-14 DIAGNOSIS — N951 Menopausal and female climacteric states: Secondary | ICD-10-CM | POA: Insufficient documentation

## 2020-07-14 LAB — LIPID PANEL
Cholesterol: 190 mg/dL (ref <200)
HDL: 53 mg/dL (ref >=50)
LDL Cholesterol (Calc): 118 mg/dL (calc) — ABNORMAL HIGH
Non-HDL Cholesterol (Calc): 137 mg/dL (calc) — ABNORMAL HIGH (ref <130)
Total CHOL/HDL Ratio: 3.6 (calc) (ref <5.0)
Triglycerides: 86 mg/dL (ref <150)

## 2020-07-14 LAB — COMPREHENSIVE METABOLIC PANEL
AG Ratio: 1.5 (calc) (ref 1.0–2.5)
ALT: 18 U/L (ref 6–29)
AST: 20 U/L (ref 10–35)
Albumin: 4.4 g/dL (ref 3.6–5.1)
Alkaline phosphatase (APISO): 86 U/L (ref 37–153)
BUN: 11 mg/dL (ref 7–25)
CO2: 27 mmol/L (ref 20–32)
Calcium: 10.4 mg/dL (ref 8.6–10.4)
Chloride: 103 mmol/L (ref 98–110)
Creat: 0.59 mg/dL (ref 0.50–1.05)
Globulin: 3 g/dL (calc) (ref 1.9–3.7)
Glucose, Bld: 90 mg/dL (ref 65–99)
Potassium: 4.1 mmol/L (ref 3.5–5.3)
Sodium: 140 mmol/L (ref 135–146)
Total Bilirubin: 0.6 mg/dL (ref 0.2–1.2)
Total Protein: 7.4 g/dL (ref 6.1–8.1)

## 2020-07-14 LAB — CBC
HCT: 36.4 % (ref 35.0–45.0)
Hemoglobin: 12.1 g/dL (ref 11.7–15.5)
MCH: 28.2 pg (ref 27.0–33.0)
MCHC: 33.2 g/dL (ref 32.0–36.0)
MCV: 84.8 fL (ref 80.0–100.0)
MPV: 12.2 fL (ref 7.5–12.5)
Platelets: 200 10*3/uL (ref 140–400)
RBC: 4.29 10*6/uL (ref 3.80–5.10)
RDW: 12.4 % (ref 11.0–15.0)
WBC: 7.4 10*3/uL (ref 3.8–10.8)

## 2020-07-14 LAB — VITAMIN D 25 HYDROXY (VIT D DEFICIENCY, FRACTURES): Vit D, 25-Hydroxy: 27 ng/mL — ABNORMAL LOW (ref 30–100)

## 2020-07-14 NOTE — Assessment & Plan Note (Signed)
CMET and Lipid profile today Encouraged her to consume a low fat diet Continue Atorvastatin 

## 2020-07-14 NOTE — Assessment & Plan Note (Signed)
Continue Venlafaxine and Trazadone Will continue to monitor

## 2020-07-14 NOTE — Assessment & Plan Note (Signed)
BP elevated today Continue HCTZ and potassium  CMET today If remains elevated, will need to add another agent Will monitor for now

## 2020-07-19 ENCOUNTER — Encounter: Payer: Self-pay | Admitting: Internal Medicine

## 2020-08-06 ENCOUNTER — Other Ambulatory Visit: Payer: Self-pay | Admitting: Internal Medicine

## 2020-08-06 DIAGNOSIS — E7800 Pure hypercholesterolemia, unspecified: Secondary | ICD-10-CM

## 2020-08-06 DIAGNOSIS — I10 Essential (primary) hypertension: Secondary | ICD-10-CM

## 2020-08-06 DIAGNOSIS — N951 Menopausal and female climacteric states: Secondary | ICD-10-CM

## 2020-09-03 LAB — HM MAMMOGRAPHY

## 2020-09-04 ENCOUNTER — Other Ambulatory Visit: Payer: Self-pay | Admitting: Internal Medicine

## 2020-11-23 ENCOUNTER — Ambulatory Visit: Payer: 59 | Admitting: Internal Medicine

## 2020-11-23 ENCOUNTER — Encounter: Payer: Self-pay | Admitting: Internal Medicine

## 2020-11-23 ENCOUNTER — Other Ambulatory Visit: Payer: Self-pay

## 2020-11-23 VITALS — BP 134/84 | HR 82 | Temp 98.3°F | Wt 138.0 lb

## 2020-11-23 DIAGNOSIS — E041 Nontoxic single thyroid nodule: Secondary | ICD-10-CM

## 2020-11-23 NOTE — Patient Instructions (Signed)
Thyroid Nodule  A thyroid nodule is an isolated growth of thyroid cells that forms a lump in your thyroid gland. The thyroid gland is a butterfly-shaped gland. It is found in the lower front of your neck. This gland sends chemical messengers (hormones) through your blood to all parts of your body. These hormones are important in regulating your body temperature and helping your body to use energy. Thyroid nodules are common. Most are not cancerous (benign). You may have one nodule or several nodules. Different types of thyroid nodules include nodules that:  Grow and fill with fluid (thyroid cysts).  Produce too much thyroid hormone (hot nodules or hyperthyroid).  Produce no thyroid hormone (cold nodules or hypothyroid).  Form from cancer cells (thyroid cancers). What are the causes? In most cases, the cause of this condition is not known. What increases the risk? The following factors may make you more likely to develop this condition.  Age. Thyroid nodules become more common in people who are older than 60 years of age.  Gender. ? Benign thyroid nodules are more common in women. ? Cancerous (malignant) thyroid nodules are more common in men.  A family history that includes: ? Thyroid nodules. ? Pheochromocytoma. ? Thyroid carcinoma. ? Hyperparathyroidism.  Certain kinds of thyroid diseases, such as Hashimoto's thyroiditis.  Lack of iodine in your diet.  A history of head and neck radiation, such as from previous cancer treatment. What are the signs or symptoms? In many cases, there are no symptoms. If you have symptoms, they may include:  A lump in your lower neck.  Feeling a lump or tickle in your throat.  Pain in your neck, jaw, or ear.  Having trouble swallowing. Hot nodules may cause symptoms that include:  Weight loss.  Warm, flushed skin.  Feeling hot.  Feeling nervous.  A racing heartbeat. Cold nodules may cause symptoms that include:  Weight  gain.  Dry skin.  Brittle hair. This may also occur with hair loss.  Feeling cold.  Fatigue. Thyroid cancer nodules may cause symptoms that include:  Hard nodules that feel stuck to the thyroid gland.  Hoarseness.  Lumps in the glands near your thyroid (lymph nodes). How is this diagnosed? A thyroid nodule may be felt by your health care provider during a physical exam. This condition may also be diagnosed based on your symptoms. You may also have tests, including:  An ultrasound. This may be done to confirm the diagnosis.  A biopsy. This involves taking a sample from the nodule and looking at it under a microscope.  Blood tests to make sure that your thyroid is working properly.  A thyroid scan. This test uses a radioactive tracer injected into a vein to create an image of the thyroid gland on a computer screen.  Imaging tests such as MRI or CT scan. These may be done if: ? Your nodule is large. ? Your nodule is blocking your airway. ? Cancer is suspected. How is this treated? Treatment depends on the cause and size of your nodule or nodules. If the nodule is benign, treatment may not be necessary. Your health care provider may monitor the nodule to see if it goes away without treatment. If the nodule continues to grow, is cancerous, or does not go away, treatment may be needed. Treatment may include:  Having a cystic nodule drained with a needle.  Ablation therapy. In this treatment, alcohol is injected into the area of the nodule to destroy the cells. Ablation with heat (  thermal ablation) may also be used.  Radioactive iodine. In this treatment, radioactive iodine is given as a pill or liquid that you drink. This substance causes the thyroid nodule to shrink.  Surgery to remove the nodule. Part or all of your thyroid gland may need to be removed as well.  Medicines. Follow these instructions at home:  Pay attention to any changes in your nodule.  Take  over-the-counter and prescription medicines only as told by your health care provider.  Keep all follow-up visits as told by your health care provider. This is important. Contact a health care provider if:  Your voice changes.  You have trouble swallowing.  You have pain in your neck, ear, or jaw that is getting worse.  Your nodule gets bigger.  Your nodule starts to make it harder for you to breathe.  Your muscles look like they are shrinking (muscle wasting). Get help right away if:  You have chest pain.  There is a loss of consciousness.  You have a sudden fever.  You feel confused.  You are seeing or hearing things that other people do not see or hear (having hallucinations).  You feel very weak.  You have mood swings.  You feel very restless.  You feel suddenly nauseous or throw up.  You suddenly have diarrhea. Summary  A thyroid nodule is an isolated growth of thyroid cells that forms a lump in your thyroid gland.  Thyroid nodules are common. Most are not cancerous (benign). You may have one nodule or several nodules.  Treatment depends on the cause and size of your nodule or nodules. If the nodule is benign, treatment may not be necessary.  Your health care provider may monitor the nodule to see if it goes away without treatment. If the nodule continues to grow, is cancerous, or does not go away, treatment may be needed. This information is not intended to replace advice given to you by your health care provider. Make sure you discuss any questions you have with your health care provider. Document Revised: 04/09/2018 Document Reviewed: 04/12/2018 Elsevier Patient Education  [place].

## 2020-11-23 NOTE — Progress Notes (Signed)
Subjective:    Patient ID: Sandra Ball, female    DOB: [birth year], 60 y.o.   MRN: [id number]  HPI  Pt presents to the clinic today with c/o a mass on the right side of her neck. She noticed this 1 month ago. She reports it is noticeable when she swallows but she denies difficulty swallowing. She denies runny nose, nasal congestion, ear pain or cough. She has not tried anything OTC for this.  Review of Systems      Past Medical History:  Diagnosis Date  . Hyperlipidemia   . Hypertension   . Ocular disease    Dr. Reino Bellis    Current Outpatient Medications  Medication Sig Dispense Refill  . atorvastatin (LIPITOR) 40 MG tablet TAKE 1 TABLET EVERY DAY 90 tablet 2  . CINNAMON PO Take by mouth.    . hydrochlorothiazide (HYDRODIURIL) 25 MG tablet TAKE 1 TABLET EVERY DAY 180 tablet 2  . potassium chloride (KLOR-CON) 10 MEQ tablet Take 1 tablet (10 mEq total) by mouth daily. 90 tablet 3  . traZODone (DESYREL) 50 MG tablet TAKE 1 TABLET BY MOUTH AT BEDTIME AS NEEDED FOR SLEEP. 90 tablet 1  . venlafaxine XR (EFFEXOR XR) 75 MG 24 hr capsule Take 1 capsule (75 mg total) by mouth daily with breakfast. 90 capsule 3   No current facility-administered medications for this visit.    Allergies  Allergen Reactions  . Aleve [Naproxen Sodium]     Family History  Problem Relation Age of Onset  . Hypertension Father   . Hypertension Mother   . Cancer Mother        lung    Social History   Socioeconomic History  . Marital status: Married    Spouse name: Not on file  . Number of children: 3  . Years of education: Not on file  . Highest education level: Not on file  Occupational History  . Not on file  Tobacco Use  . Smoking status: Never Smoker  . Smokeless tobacco: Never Used  Substance and Sexual Activity  . Alcohol use: No  . Drug use: No  . Sexual activity: Yes    Birth control/protection: None    Comment: divorced; 1 partner in past yr  Other Topics Concern  . Not on  file  Social History Narrative   Married.   Works to [employer].   3 children.   Enjoys being outdoors in the garden.    Social Determinants of Health   Financial Resource Strain: Not on file  Food Insecurity: Not on file  Transportation Needs: Not on file  Physical Activity: Not on file  Stress: Not on file  Social Connections: Not on file  Intimate Partner Violence: Not on file     Constitutional: Denies fever, malaise, fatigue, headache or abrupt weight changes.  HEENT: Denies eye pain, eye redness, ear pain, ringing in the ears, wax buildup, runny nose, nasal congestion, bloody nose, or sore throat. Respiratory: Denies difficulty breathing, shortness of breath, cough or sputum production.   Cardiovascular: Denies chest pain, chest tightness, palpitations or swelling in the hands or feet.  Skin: Pt reports mass of neck. Denies redness, rashes, lesions or ulcercations.    No other specific complaints in a complete review of systems (except as listed in HPI above).  Objective:   Physical Exam   BP 134/84   Pulse 82   Temp 98.3 F (36.8 C) (Temporal)   Wt 138 lb (62.6 kg)   SpO2  98%   BMI 25.65 kg/m  Wt Readings from Last 3 Encounters:  11/23/20 138 lb (62.6 kg)  07/13/20 137 lb 12 oz (62.5 kg)  02/10/20 137 lb (62.1 kg)    General: Appears her stated age, well developed, well nourished in NAD. Skin: Warm, dry and intact.  HEENT: Head: normal shape and size; Eyes: sclera white, no icterus, conjunctiva pink, PERRLA and EOMs intact;  Neck:  Neck supple, trachea midline. 2 cm by 1 cm anterior right thyroid nodule noted. Cardiovascular: Normal rate and rhythm.  Pulmonary/Chest: Normal effort and positive vesicular breath sounds.  Neurological: Alert and oriented.   BMET    Component Value Date/Time   NA 140 07/13/2020 1623   K 4.1 07/13/2020 1623   CL 103 07/13/2020 1623   CO2 27 07/13/2020 1623   GLUCOSE 90 07/13/2020 1623   BUN 11 07/13/2020 1623    CREATININE 0.59 07/13/2020 1623   CALCIUM 10.4 07/13/2020 1623    Lipid Panel     Component Value Date/Time   CHOL 190 07/13/2020 1623   TRIG 86 07/13/2020 1623   HDL 53 07/13/2020 1623   CHOLHDL 3.6 07/13/2020 1623   VLDL 26.4 07/16/2018 1452   LDLCALC 118 (H) 07/13/2020 1623    CBC    Component Value Date/Time   WBC 7.4 07/13/2020 1623   RBC 4.29 07/13/2020 1623   HGB 12.1 07/13/2020 1623   HCT 36.4 07/13/2020 1623   PLT 200 07/13/2020 1623   MCV 84.8 07/13/2020 1623   MCH 28.2 07/13/2020 1623   MCHC 33.2 07/13/2020 1623   RDW 12.4 07/13/2020 1623    Hgb A1C Lab Results  Component Value Date   HGBA1C 6.0 07/10/2017           Assessment & Plan:   Right Thyroid Nodule:  TSH, Free T4 and T3 today Will obtain thyroid ultrasound for further evaluation and treatment  Will follow up after labs and imaging, return precautions discussed Webb Silversmith, NP This visit occurred during the SARS-CoV-2 public health emergency.  Safety protocols were in place, including screening questions prior to the visit, additional usage of staff PPE, and extensive cleaning of exam room while observing appropriate contact time as indicated for disinfecting solutions.

## 2020-11-24 LAB — T4, FREE: Free T4: 1 ng/dL (ref 0.8–1.8)

## 2020-11-24 LAB — T3: T3, Total: 79 ng/dL (ref 76–181)

## 2020-11-24 LAB — TSH: TSH: 4.23 mIU/L (ref 0.40–4.50)

## 2020-11-25 ENCOUNTER — Encounter: Payer: Self-pay | Admitting: Internal Medicine

## 2020-11-26 NOTE — Telephone Encounter (Signed)
Noted.  Spoke with Vallarie Mare, days available are Fridays in the PM, starting 12/07/20 Aware that I will call once scheduled.  Prefers [place]

## 2020-11-30 NOTE — Telephone Encounter (Signed)
Patient scheduled on 12/14/20  Nothing further needed.

## 2020-12-14 ENCOUNTER — Other Ambulatory Visit: Payer: Self-pay

## 2020-12-14 ENCOUNTER — Ambulatory Visit
Admission: RE | Admit: 2020-12-14 | Discharge: 2020-12-14 | Disposition: A | Discharge status: Home / Self Care | Payer: 59 | Source: Ambulatory Visit | Attending: Internal Medicine | Admitting: Internal Medicine

## 2020-12-14 DIAGNOSIS — E041 Nontoxic single thyroid nodule: Secondary | ICD-10-CM

## 2021-02-04 ENCOUNTER — Other Ambulatory Visit: Payer: Self-pay | Admitting: Internal Medicine

## 2021-02-04 DIAGNOSIS — N951 Menopausal and female climacteric states: Secondary | ICD-10-CM

## 2021-02-06 NOTE — Telephone Encounter (Signed)
Refill request Trazodone Last refill 08/08/20 #90/1 Last office visit 11/23/20 No upcoming appointment scheduled

## 2021-04-23 ENCOUNTER — Other Ambulatory Visit: Payer: Self-pay | Admitting: Internal Medicine

## 2021-04-23 NOTE — Telephone Encounter (Signed)
  Notes to clinic Not a pt in this practice.

## 2021-04-24 NOTE — Telephone Encounter (Signed)
LAST APPOINTMENT DATE: 11/23/2020   NEXT APPOINTMENT DATE: Visit date not found    LAST REFILL: 03/29/2020  QTY: #90 3 rf

## 2021-05-05 ENCOUNTER — Other Ambulatory Visit: Payer: Self-pay | Admitting: Internal Medicine

## 2021-05-05 DIAGNOSIS — I10 Essential (primary) hypertension: Secondary | ICD-10-CM

## 2021-05-05 NOTE — Telephone Encounter (Signed)
Requested medication (s) are due for refill today: yes  Requested medication (s) are on the active medication list: yes  Last refill:  Klor-Con: 03/29/20     Atorvastatin: 08/07/20  Future visit scheduled: pt has new pt visit in 2 months.   Notes to clinic:  pt without PCP- has upcoming appt in 2 months.   Requested Prescriptions  Pending Prescriptions Disp Refills   KLOR-CON M10 10 MEQ tablet [Pharmacy Med Name: KLOR-CON M10 TABLET] 90 tablet 2    Sig: TAKE 1 TABLET EVERY DAY     Endocrinology:  Minerals - Potassium Supplementation Failed - 05/05/2021 12:47 AM      Failed - Valid encounter within last 12 months    Recent Outpatient Visits           6 years ago Routine general medical examination at a health care facility   Primary Care at [facility], Dalbert Batman, [place]   8 years ago Need for influenza vaccination   [facility] at [facility], Laurey Arrow, [place]       Future Appointments             In 2 months Baity, Coralie Keens, NP [place] in normal range and within 360 days    Potassium  Date Value Ref Range Status  07/13/2020 4.1 3.5 - 5.3 mmol/L Final          Passed - Cr in normal range and within 360 days    Creat  Date Value Ref Range Status  07/13/2020 0.59 0.50 - 1.05 mg/dL Final    Comment:    For patients >15 years of age, the reference limit for Creatinine is approximately 13% higher for people identified as African-American. .            atorvastatin (LIPITOR) 40 MG tablet [Pharmacy Med Name: ATORVASTATIN 40 MG TABLET] 90 tablet 2    Sig: TAKE 1 TABLET BY MOUTH EVERY DAY     Cardiovascular:  Antilipid - Statins Failed - 05/05/2021 12:47 AM      Failed - LDL in normal range and within 360 days    LDL Cholesterol (Calc)  Date Value Ref Range Status  07/13/2020 118 (H) mg/dL (calc) Final    Comment:    Reference range: <100 . Desirable range <100 mg/dL for primary prevention;   <70 mg/dL for patients  with CHD or diabetic patients  with > or = 2 CHD risk factors. Marland Kitchen LDL-C is now calculated using the Martin-Hopkins  calculation, which is a validated novel method providing  better accuracy than the Friedewald equation in the  estimation of LDL-C.  Cresenciano Genre et al. Annamaria Helling. [id number]): 2061-2068  (http://education.[employer].com/faq/FAQ164)           Failed - Valid encounter within last 12 months    Recent Outpatient Visits           6 years ago Routine general medical examination at a health care facility   Primary Care at [facility], Dalbert Batman, [place]   8 years ago Need for influenza vaccination   Primary Care at [facility], Laurey Arrow, MD       Future Appointments             In 2 months Baity, Coralie Keens, NP [facility], [place] - Total Cholesterol  in normal range and within 360 days    Cholesterol  Date Value Ref Range Status  07/13/2020 190 <200 mg/dL Final          Passed - HDL in normal range and within 360 days    HDL  Date Value Ref Range Status  07/13/2020 53 > OR = 50 mg/dL Final          Passed - Triglycerides in normal range and within 360 days    Triglycerides  Date Value Ref Range Status  07/13/2020 86 <150 mg/dL Final          Passed - Patient is not pregnant

## 2021-07-15 ENCOUNTER — Other Ambulatory Visit: Payer: Self-pay

## 2021-07-15 ENCOUNTER — Encounter: Payer: Self-pay | Admitting: Internal Medicine

## 2021-07-15 ENCOUNTER — Ambulatory Visit: Payer: BC Managed Care – PPO | Admitting: Internal Medicine

## 2021-07-15 VITALS — BP 133/71 | HR 71 | Temp 97.1°F | Ht 61.5 in | Wt 137.6 lb

## 2021-07-15 DIAGNOSIS — I10 Essential (primary) hypertension: Secondary | ICD-10-CM | POA: Diagnosis not present

## 2021-07-15 DIAGNOSIS — R7303 Prediabetes: Secondary | ICD-10-CM

## 2021-07-15 DIAGNOSIS — Z780 Asymptomatic menopausal state: Secondary | ICD-10-CM

## 2021-07-15 DIAGNOSIS — E785 Hyperlipidemia, unspecified: Secondary | ICD-10-CM

## 2021-07-15 DIAGNOSIS — Z0001 Encounter for general adult medical examination with abnormal findings: Secondary | ICD-10-CM

## 2021-07-15 DIAGNOSIS — Z23 Encounter for immunization: Secondary | ICD-10-CM

## 2021-07-15 DIAGNOSIS — N951 Menopausal and female climacteric states: Secondary | ICD-10-CM

## 2021-07-15 DIAGNOSIS — Z1231 Encounter for screening mammogram for malignant neoplasm of breast: Secondary | ICD-10-CM | POA: Diagnosis not present

## 2021-07-15 NOTE — Progress Notes (Signed)
Subjective:    Patient ID: Sandra Ball, female    DOB: October 09, 1960, 60 y.o.   MRN: [id number]  HPI  Pt presents to the clinic today for her annual exam. She is also due to follow up chronic conditions.  HTN: Her BP today is 133/71. She is taking HCTZ as prescribed. ECG from 11/2019 reviewed.  HLD: Her last LDL was 118, triglycerides 53, 07/2020. She denies myalgias on Atorvastatin. She does not consume a low fat diet.  Menopausal Symptoms: Mainly hot flashes. She is taking Venlafaxine as prescribed.   Insomnia: She has trouble falling asleep. She takes Trazadone as needed with good results. There is no sleep study on file.  Prediabetes: Her last A1C was 6%, 2018. She is not taking any oral diabetic medication at this time. She does not check her sugars.  Flu: 07/2020 Tetanus: 06/2014 Covid: Pfizer x 3 Shingrix: never Pap Smear: 07/2017 Mammogram: 08/2020 Bone Density: never Colon Screening: 2015 at [place] Screening: annually Dentist: annually  Diet: She does eat meat. She consumes some fruits and veggies. She does eat some fried foods. She drinks mostly water and soda. Exercise: Walking  Review of Systems     Past Medical History:  Diagnosis Date   Hyperlipidemia    Hypertension    Ocular disease    Dr. Reino Bellis    Current Outpatient Medications  Medication Sig Dispense Refill   atorvastatin (LIPITOR) 40 MG tablet TAKE 1 TABLET BY MOUTH EVERY DAY 90 tablet 1   CINNAMON PO Take by mouth.     hydrochlorothiazide (HYDRODIURIL) 25 MG tablet TAKE 1 TABLET EVERY DAY 180 tablet 2   KLOR-CON M10 10 MEQ tablet TAKE 1 TABLET EVERY DAY 90 tablet 1   potassium chloride (KLOR-CON) 10 MEQ tablet Take 1 tablet (10 mEq total) by mouth daily. 90 tablet 3   traZODone (DESYREL) 50 MG tablet TAKE 1 TABLET BY MOUTH EVERY DAY AT BEDTIME AS NEEDED FOR SLEEP 90 tablet 1   venlafaxine XR (EFFEXOR-XR) 75 MG 24 hr capsule TAKE ONE CAPSULE DAILY WITH BREAKFAST 90 capsule 2    No current facility-administered medications for this visit.    Allergies  Allergen Reactions   Aleve [Naproxen Sodium]     Family History  Problem Relation Age of Onset   Hypertension Father    Hypertension Mother    Cancer Mother        lung    Social History   Socioeconomic History   Marital status: Married    Spouse name: Not on file   Number of children: 3   Years of education: Not on file   Highest education level: Not on file  Occupational History   Not on file  Tobacco Use   Smoking status: Never   Smokeless tobacco: Never  Substance and Sexual Activity   Alcohol use: No   Drug use: No   Sexual activity: Yes    Birth control/protection: None    Comment: divorced; 1 partner in past yr  Other Topics Concern   Not on file  Social History Narrative   Married.   Works to [employer].   3 children.   Enjoys being outdoors in the garden.    Social Determinants of Health   Financial Resource Strain: Not on file  Food Insecurity: Not on file  Transportation Needs: Not on file  Physical Activity: Not on file  Stress: Not on file  Social Connections: Not on file  Intimate Partner Violence: Not  on file     Constitutional: Denies fever, malaise, fatigue, headache or abrupt weight changes.  HEENT: Denies eye pain, eye redness, ear pain, ringing in the ears, wax buildup, runny nose, nasal congestion, bloody nose, or sore throat. Respiratory: Denies difficulty breathing, shortness of breath, cough or sputum production.   Cardiovascular: Denies chest pain, chest tightness, palpitations or swelling in the hands or feet.  Gastrointestinal: Denies abdominal pain, bloating, constipation, diarrhea or blood in the stool.  GU: Denies urgency, frequency, pain with urination, burning sensation, blood in urine, odor or discharge. Musculoskeletal: Denies decrease in range of motion, difficulty with gait, muscle pain or joint pain and swelling.  Skin: Denies redness,  rashes, lesions or ulcercations.  Neurological: Pt reports insomnia, hot flashes. Denies dizziness, difficulty with memory, difficulty with speech or problems with balance and coordination.  Psych: Denies anxiety, depression, SI/HI.  No other specific complaints in a complete review of systems (except as listed in HPI above).  Objective:   Physical Exam BP 133/71 (BP Location: Right Arm, Patient Position: Sitting, Cuff Size: Normal)   Pulse 71   Temp (!) 97.1 F (36.2 C) (Temporal)   Ht 5' 1.5" (1.562 m)   Wt 137 lb 9.6 oz (62.4 kg)   SpO2 100%   BMI 25.58 kg/m   Wt Readings from Last 3 Encounters:  11/23/20 138 lb (62.6 kg)  07/13/20 137 lb 12 oz (62.5 kg)  02/10/20 137 lb (62.1 kg)    General: Appears her stated age, obese, in NAD. Skin: Warm, dry and intact.  HEENT: Head: normal shape and size; Eyes: sclera white and EOMs intact;  Neck:  Neck supple, trachea midline.Right thyroid nodules noted. Cardiovascular: Normal rate and rhythm. S1,S2 noted.  No murmur, rubs or gallops noted. No JVD or BLE edema. No carotid bruits noted. Pulmonary/Chest: Normal effort and positive vesicular breath sounds. No respiratory distress. No wheezes, rales or ronchi noted.  Abdomen: Soft and nontender. Normal bowel sounds. No distention or masses noted. Liver, spleen and kidneys non palpable. Musculoskeletal: Strength 5/5 BUR/BLE. No difficulty with gait.  Neurological: Alert and oriented. Cranial nerves II-XII grossly intact. Coordination normal.  Psychiatric: Mood and affect normal. Behavior is normal. Judgment and thought content normal.     BMET    Component Value Date/Time   NA 140 07/13/2020 1623   K 4.1 07/13/2020 1623   CL 103 07/13/2020 1623   CO2 27 07/13/2020 1623   GLUCOSE 90 07/13/2020 1623   BUN 11 07/13/2020 1623   CREATININE 0.59 07/13/2020 1623   CALCIUM 10.4 07/13/2020 1623    Lipid Panel     Component Value Date/Time   CHOL 190 07/13/2020 1623   TRIG 86  07/13/2020 1623   HDL 53 07/13/2020 1623   CHOLHDL 3.6 07/13/2020 1623   VLDL 26.4 07/16/2018 1452   LDLCALC 118 (H) 07/13/2020 1623    CBC    Component Value Date/Time   WBC 7.4 07/13/2020 1623   RBC 4.29 07/13/2020 1623   HGB 12.1 07/13/2020 1623   HCT 36.4 07/13/2020 1623   PLT 200 07/13/2020 1623   MCV 84.8 07/13/2020 1623   MCH 28.2 07/13/2020 1623   MCHC 33.2 07/13/2020 1623   RDW 12.4 07/13/2020 1623    Hgb A1C Lab Results  Component Value Date   HGBA1C 6.0 07/10/2017            Assessment & Plan:   Preventative Health Maintenance:  Flu shot today Tetanus UTD Discussed Shingrix vaccine. She  will check coverage with her insurance company Pap smear due 2023 Mammogram due 08/2021 Bone density due- she will call to schedule Colon cancer screening Encouraged her to consume a balanced diet and exercise regimen Advised her to see an eye doctor and dentist annually Will check CBC, CMET, Lipid and A1C today  RTC in 1 year, sooner if needed Webb Silversmith, NP This visit occurred during the SARS-CoV-2 public health emergency.  Safety protocols were in place, including screening questions prior to the visit, additional usage of staff PPE, and extensive cleaning of exam room while observing appropriate contact time as indicated for disinfecting solutions.

## 2021-07-15 NOTE — Patient Instructions (Signed)
Health Maintenance for Postmenopausal Women ?Menopause is a normal process in which your ability to get pregnant comes to an end. This process happens slowly over many months or years, usually between the ages of 48 and 55. Menopause is complete when you have missed your menstrual period for 12 months. ?It is important to talk with your health care provider about some of the most common conditions that affect women after menopause (postmenopausal women). These include heart disease, cancer, and bone loss (osteoporosis). Adopting a healthy lifestyle and getting preventive care can help to promote your health and wellness. The actions you take can also lower your chances of developing some of these common conditions. ?What are the signs and symptoms of menopause? ?During menopause, you may have the following symptoms: ?Hot flashes. These can be moderate or severe. ?Night sweats. ?Decrease in sex drive. ?Mood swings. ?Headaches. ?Tiredness (fatigue). ?Irritability. ?Memory problems. ?Problems falling asleep or staying asleep. ?Talk with your health care provider about treatment options for your symptoms. ?Do I need hormone replacement therapy? ?Hormone replacement therapy is effective in treating symptoms that are caused by menopause, such as hot flashes and night sweats. ?Hormone replacement carries certain risks, especially as you become older. If you are thinking about using estrogen or estrogen with progestin, discuss the benefits and risks with your health care provider. ?How can I reduce my risk for heart disease and stroke? ?The risk of heart disease, heart attack, and stroke increases as you age. One of the causes may be a change in the body's hormones during menopause. This can affect how your body uses dietary fats, triglycerides, and cholesterol. Heart attack and stroke are medical emergencies. There are many things that you can do to help prevent heart disease and stroke. ?Watch your blood pressure ?High  blood pressure causes heart disease and increases the risk of stroke. This is more likely to develop in people who have high blood pressure readings or are overweight. ?Have your blood pressure checked: ?Every 3-5 years if you are 18-39 years of age. ?Every year if you are 40 years old or older. ?Eat a healthy diet ? ?Eat a diet that includes plenty of vegetables, fruits, low-fat dairy products, and lean protein. ?Do not eat a lot of foods that are high in solid fats, added sugars, or sodium. ?Get regular exercise ?Get regular exercise. This is one of the most important things you can do for your health. Most adults should: ?Try to exercise for at least 150 minutes each week. The exercise should increase your heart rate and make you sweat (moderate-intensity exercise). ?Try to do strengthening exercises at least twice each week. Do these in addition to the moderate-intensity exercise. ?Spend less time sitting. Even light physical activity can be beneficial. ?Other tips ?Work with your health care provider to achieve or maintain a healthy weight. ?Do not use any products that contain nicotine or tobacco. These products include cigarettes, chewing tobacco, and vaping devices, such as e-cigarettes. If you need help quitting, ask your health care provider. ?Know your numbers. Ask your health care provider to check your cholesterol and your blood sugar (glucose). Continue to have your blood tested as directed by your health care provider. ?Do I need screening for cancer? ?Depending on your health history and family history, you may need to have cancer screenings at different stages of your life. This may include screening for: ?Breast cancer. ?Cervical cancer. ?Lung cancer. ?Colorectal cancer. ?What is my risk for osteoporosis? ?After menopause, you may be   at increased risk for osteoporosis. Osteoporosis is a condition in which bone destruction happens more quickly than new bone creation. To help prevent osteoporosis or  the bone fractures that can happen because of osteoporosis, you may take the following actions: ?If you are 19-50 years old, get at least 1,000 mg of calcium and at least 600 international units (IU) of vitamin D per day. ?If you are older than age 50 but younger than age 70, get at least 1,200 mg of calcium and at least 600 international units (IU) of vitamin D per day. ?If you are older than age 70, get at least 1,200 mg of calcium and at least 800 international units (IU) of vitamin D per day. ?Smoking and drinking excessive alcohol increase the risk of osteoporosis. Eat foods that are rich in calcium and vitamin D, and do weight-bearing exercises several times each week as directed by your health care provider. ?How does menopause affect my mental health? ?Depression may occur at any age, but it is more common as you become older. Common symptoms of depression include: ?Feeling depressed. ?Changes in sleep patterns. ?Changes in appetite or eating patterns. ?Feeling an overall lack of motivation or enjoyment of activities that you previously enjoyed. ?Frequent crying spells. ?Talk with your health care provider if you think that you are experiencing any of these symptoms. ?General instructions ?See your health care provider for regular wellness exams and vaccines. This may include: ?Scheduling regular health, dental, and eye exams. ?Getting and maintaining your vaccines. These include: ?Influenza vaccine. Get this vaccine each year before the flu season begins. ?Pneumonia vaccine. ?Shingles vaccine. ?Tetanus, diphtheria, and pertussis (Tdap) booster vaccine. ?Your health care provider may also recommend other immunizations. ?Tell your health care provider if you have ever been abused or do not feel safe at home. ?Summary ?Menopause is a normal process in which your ability to get pregnant comes to an end. ?This condition causes hot flashes, night sweats, decreased interest in sex, mood swings, headaches, or lack  of sleep. ?Treatment for this condition may include hormone replacement therapy. ?Take actions to keep yourself healthy, including exercising regularly, eating a healthy diet, watching your weight, and checking your blood pressure and blood sugar levels. ?Get screened for cancer and depression. Make sure that you are up to date with all your vaccines. ?This information is not intended to replace advice given to you by your health care provider. Make sure you discuss any questions you have with your health care provider. ?Document Revised: 01/14/2021 Document Reviewed: 01/14/2021 ?Elsevier Patient Education ? 2022 [employer] ? ?

## 2021-07-15 NOTE — Assessment & Plan Note (Signed)
Controlled on HCTZ Reinforced DASH diet and exercise for weight loss CMET today

## 2021-07-15 NOTE — Assessment & Plan Note (Signed)
CMET and lipid profile today Encouraged her to consume a low fat diet Continue Atorvastatin 

## 2021-07-15 NOTE — Assessment & Plan Note (Signed)
A1C today Encouraged low carb diet and exercise for weight loss 

## 2021-07-15 NOTE — Assessment & Plan Note (Signed)
Stable on Venlafaxine Will monitor

## 2021-07-16 LAB — HEMOGLOBIN A1C
Hgb A1c MFr Bld: 5.7 % of total Hgb — ABNORMAL HIGH (ref <5.7)
Mean Plasma Glucose: 117 mg/dL
eAG (mmol/L): 6.5 mmol/L

## 2021-07-16 LAB — COMPLETE METABOLIC PANEL WITH GFR
AG Ratio: 1.4 (calc) (ref 1.0–2.5)
ALT: 35 U/L — ABNORMAL HIGH (ref 6–29)
AST: 29 U/L (ref 10–35)
Albumin: 4.5 g/dL (ref 3.6–5.1)
Alkaline phosphatase (APISO): 137 U/L (ref 37–153)
BUN: 11 mg/dL (ref 7–25)
CO2: 25 mmol/L (ref 20–32)
Calcium: 10.3 mg/dL (ref 8.6–10.4)
Chloride: 103 mmol/L (ref 98–110)
Creat: 0.61 mg/dL (ref 0.50–1.05)
Globulin: 3.2 g/dL (calc) (ref 1.9–3.7)
Glucose, Bld: 85 mg/dL (ref 65–99)
Potassium: 3.5 mmol/L (ref 3.5–5.3)
Sodium: 139 mmol/L (ref 135–146)
Total Bilirubin: 0.7 mg/dL (ref 0.2–1.2)
Total Protein: 7.7 g/dL (ref 6.1–8.1)
eGFR: 102 mL/min/{1.73_m2} (ref >=60)

## 2021-07-16 LAB — CBC
HCT: 35.7 % (ref 35.0–45.0)
Hemoglobin: 11.8 g/dL (ref 11.7–15.5)
MCH: 27.8 pg (ref 27.0–33.0)
MCHC: 33.1 g/dL (ref 32.0–36.0)
MCV: 84 fL (ref 80.0–100.0)
MPV: 12.1 fL (ref 7.5–12.5)
Platelets: 196 10*3/uL (ref 140–400)
RBC: 4.25 10*6/uL (ref 3.80–5.10)
RDW: 13.1 % (ref 11.0–15.0)
WBC: 7.5 10*3/uL (ref 3.8–10.8)

## 2021-07-16 LAB — LIPID PANEL
Cholesterol: 203 mg/dL — ABNORMAL HIGH (ref <200)
HDL: 52 mg/dL (ref >=50)
LDL Cholesterol (Calc): 126 mg/dL (calc) — ABNORMAL HIGH
Non-HDL Cholesterol (Calc): 151 mg/dL (calc) — ABNORMAL HIGH (ref <130)
Total CHOL/HDL Ratio: 3.9 (calc) (ref <5.0)
Triglycerides: 141 mg/dL (ref <150)

## 2021-07-19 ENCOUNTER — Encounter: Payer: Self-pay | Admitting: Internal Medicine

## 2021-07-19 DIAGNOSIS — E7800 Pure hypercholesterolemia, unspecified: Secondary | ICD-10-CM

## 2021-07-19 DIAGNOSIS — I10 Essential (primary) hypertension: Secondary | ICD-10-CM

## 2021-07-22 MED ORDER — HYDROCHLOROTHIAZIDE 25 MG PO TABS: 25.0000 mg | ORAL_TABLET | Freq: Every day | ORAL | 3 refills | Status: DC

## 2021-07-22 MED ORDER — POTASSIUM CHLORIDE CRYS ER 10 MEQ PO TBCR: 10.0000 meq | EXTENDED_RELEASE_TABLET | Freq: Every day | ORAL | 3 refills | Status: DC

## 2021-07-22 MED ORDER — VENLAFAXINE HCL ER 75 MG PO CP24: ORAL_CAPSULE | ORAL | 3 refills | Status: DC

## 2021-07-22 MED ORDER — ATORVASTATIN CALCIUM 40 MG PO TABS: 40.0000 mg | ORAL_TABLET | Freq: Every day | ORAL | 3 refills | Status: DC

## 2021-07-25 DIAGNOSIS — H43811 Vitreous degeneration, right eye: Secondary | ICD-10-CM | POA: Diagnosis not present

## 2021-09-05 ENCOUNTER — Encounter: Payer: Self-pay | Admitting: Internal Medicine

## 2021-09-05 DIAGNOSIS — Z780 Asymptomatic menopausal state: Secondary | ICD-10-CM

## 2021-09-05 DIAGNOSIS — N951 Menopausal and female climacteric states: Secondary | ICD-10-CM

## 2021-09-05 DIAGNOSIS — Z1231 Encounter for screening mammogram for malignant neoplasm of breast: Secondary | ICD-10-CM

## 2021-09-06 MED ORDER — TRAZODONE HCL 50 MG PO TABS: ORAL_TABLET | ORAL | 0 refills | Status: DC

## 2021-09-06 NOTE — Telephone Encounter (Signed)
Please review.  KP

## 2021-09-10 ENCOUNTER — Other Ambulatory Visit: Payer: Self-pay | Admitting: Internal Medicine

## 2021-09-10 DIAGNOSIS — Z1231 Encounter for screening mammogram for malignant neoplasm of breast: Secondary | ICD-10-CM

## 2021-11-13 DIAGNOSIS — H353232 Exudative age-related macular degeneration, bilateral, with inactive choroidal neovascularization: Secondary | ICD-10-CM | POA: Diagnosis not present

## 2021-11-13 DIAGNOSIS — H43813 Vitreous degeneration, bilateral: Secondary | ICD-10-CM | POA: Diagnosis not present

## 2021-12-12 ENCOUNTER — Other Ambulatory Visit: Payer: Self-pay | Admitting: Internal Medicine

## 2021-12-12 DIAGNOSIS — N951 Menopausal and female climacteric states: Secondary | ICD-10-CM

## 2021-12-12 NOTE — Telephone Encounter (Signed)
Requested Prescriptions  ?Pending Prescriptions Disp Refills  ?? traZODone (DESYREL) 50 MG tablet [Pharmacy Med Name: TRAZODONE 50 MG TABLET] 30 tablet 0  ?  Sig: TAKE 1 TABLET BY MOUTH AT BEDTIME AS NEEDED FOR SLEEP  ?  ? Psychiatry: Antidepressants - Serotonin Modulator Passed - 12/12/2021  2:16 AM  ?  ?  Passed - Valid encounter within last 6 months  ?  Recent Outpatient Visits   ?      ? 5 months ago Encounter for general adult medical examination with abnormal findings  ? [place], NP  ? 7 years ago Routine general medical examination at a health care facility  ? [facility], Dalbert Batman, FNP  ? 9 years ago Need for influenza vaccination  ? [facility], Laurey Arrow, MD  ?  ?  ? ?  ?  ?  ? ? ?

## 2022-01-11 ENCOUNTER — Other Ambulatory Visit: Payer: Self-pay | Admitting: Internal Medicine

## 2022-01-11 DIAGNOSIS — N951 Menopausal and female climacteric states: Secondary | ICD-10-CM

## 2022-01-13 NOTE — Telephone Encounter (Signed)
Requested Prescriptions  ?Pending Prescriptions Disp Refills  ?? traZODone (DESYREL) 50 MG tablet [Pharmacy Med Name: TRAZODONE 50 MG TABLET] 90 tablet 1  ?  Sig: TAKE 1 TABLET BY MOUTH EVERY DAY AT BEDTIME AS NEEDED FOR SLEEP  ?  ? Psychiatry: Antidepressants - Serotonin Modulator Passed - 01/11/2022 12:31 PM  ?  ?  Passed - Valid encounter within last 6 months  ?  Recent Outpatient Visits   ?      ? 6 months ago Encounter for general adult medical examination with abnormal findings  ? [facility], NP  ? 7 years ago Routine general medical examination at a health care facility  ? [facility], Dalbert Batman, FNP  ? 9 years ago Need for influenza vaccination  ? [facility], Laurey Arrow, MD  ?  ?  ? ?  ?  ?  ? ? ?

## 2022-02-14 ENCOUNTER — Ambulatory Visit
Admission: RE | Admit: 2022-02-14 | Discharge: 2022-02-14 | Disposition: A | Discharge status: Home / Self Care | Payer: BC Managed Care – PPO | Source: Ambulatory Visit | Attending: Internal Medicine | Admitting: Internal Medicine

## 2022-02-14 ENCOUNTER — Ambulatory Visit
Admission: RE | Admit: 2022-02-14 | Discharge: 2022-02-14 | Disposition: A | Discharge status: Home / Self Care | Payer: Self-pay | Source: Ambulatory Visit | Attending: Internal Medicine | Admitting: Internal Medicine

## 2022-02-14 DIAGNOSIS — Z780 Asymptomatic menopausal state: Secondary | ICD-10-CM | POA: Diagnosis not present

## 2022-02-14 DIAGNOSIS — Z1231 Encounter for screening mammogram for malignant neoplasm of breast: Secondary | ICD-10-CM | POA: Diagnosis not present

## 2022-02-14 DIAGNOSIS — M8589 Other specified disorders of bone density and structure, multiple sites: Secondary | ICD-10-CM | POA: Diagnosis not present

## 2022-02-14 DIAGNOSIS — M810 Age-related osteoporosis without current pathological fracture: Secondary | ICD-10-CM | POA: Diagnosis not present

## 2022-02-18 ENCOUNTER — Encounter: Payer: Self-pay | Admitting: Internal Medicine

## 2022-02-19 ENCOUNTER — Other Ambulatory Visit: Payer: Self-pay | Admitting: Internal Medicine

## 2022-02-19 DIAGNOSIS — M816 Localized osteoporosis [Lequesne]: Secondary | ICD-10-CM

## 2022-02-20 ENCOUNTER — Telehealth: Payer: Self-pay

## 2022-02-20 NOTE — Telephone Encounter (Signed)
Copied from [place] [id number]. Topic: General - Inquiry >> Feb 19, 2022  2:34 PM Erskine Squibb wrote: Reason for CRM: Kieth Brightly from [facility], Dr [facility]io [facility] office would like the most recent lab results to be faxed over to [phone] to the attention of [facility].

## 2022-02-20 NOTE — Telephone Encounter (Signed)
yes

## 2022-02-20 NOTE — Telephone Encounter (Signed)
Is it okay to send the lab results?  Thanks,   -Mickel Baas

## 2022-02-21 NOTE — Telephone Encounter (Signed)
Alaina from [facility] called in to follow up on receiving the patients most recent labs that were previously requested. Please assist further  Phone number for [facility] is [phone] and the fax number is [phone]

## 2022-02-21 NOTE — Telephone Encounter (Signed)
Labs fax to the number below.   Thanks,   -Mickel Baas

## 2022-03-10 ENCOUNTER — Other Ambulatory Visit: Payer: Self-pay | Admitting: Internal Medicine

## 2022-03-10 DIAGNOSIS — N951 Menopausal and female climacteric states: Secondary | ICD-10-CM

## 2022-04-12 ENCOUNTER — Other Ambulatory Visit: Payer: Self-pay | Admitting: Internal Medicine

## 2022-04-12 DIAGNOSIS — N951 Menopausal and female climacteric states: Secondary | ICD-10-CM

## 2022-04-14 ENCOUNTER — Telehealth: Payer: Self-pay | Admitting: *Deleted

## 2022-04-14 NOTE — Telephone Encounter (Signed)
Called pt to schedule for 6 month check/refills per protocol for refill of Trazodone. She informed me she is seen once a year as far as her check ups.   Visit history shows yearly visits.  Sent refill for Trazodone to [facility] or Langlois Baity's review for refill since protocol is every 6 months.

## 2022-04-14 NOTE — Telephone Encounter (Signed)
Requested medication (s) are due for refill today:   Yes  Requested medication (s) are on the active medication list:   Yes  Future visit scheduled:   No      Last ordered: 5/82022 #90, 0 refills  Returned because I called pt to schedule her 6 month check per protocol.  She mentioned she is seen every year.   Last CPE 07/15/2021.   Returned for provider to review for refill since protocol is every 6 months.  Per visit history she is seen every year.     Requested Prescriptions  Pending Prescriptions Disp Refills   traZODone (DESYREL) 50 MG tablet [Pharmacy Med Name: TRAZODONE 50 MG TABLET] 90 tablet 0    Sig: TAKE 1 TABLET BY MOUTH AT BEDTIME AS NEEDED FOR SLEEP     Psychiatry: Antidepressants - Serotonin Modulator Failed - 04/12/2022  1:18 AM      Failed - Valid encounter within last 6 months    Recent Outpatient Visits           9 months ago Encounter for general adult medical examination with abnormal findings   [facility], Coralie Keens, NP   7 years ago Routine general medical examination at a health care facility   [facility], Dalbert Batman, [place]   9 years ago Need for influenza vaccination   [facility], Laurey Arrow, MD

## 2022-05-07 ENCOUNTER — Other Ambulatory Visit: Payer: Self-pay | Admitting: Endocrinology

## 2022-05-07 DIAGNOSIS — M810 Age-related osteoporosis without current pathological fracture: Secondary | ICD-10-CM | POA: Diagnosis not present

## 2022-05-07 DIAGNOSIS — E041 Nontoxic single thyroid nodule: Secondary | ICD-10-CM | POA: Diagnosis not present

## 2022-05-07 DIAGNOSIS — I10 Essential (primary) hypertension: Secondary | ICD-10-CM | POA: Diagnosis not present

## 2022-05-07 DIAGNOSIS — E039 Hypothyroidism, unspecified: Secondary | ICD-10-CM | POA: Diagnosis not present

## 2022-05-23 ENCOUNTER — Other Ambulatory Visit: Payer: BC Managed Care – PPO

## 2022-05-30 ENCOUNTER — Ambulatory Visit
Admission: RE | Admit: 2022-05-30 | Discharge: 2022-05-30 | Disposition: A | Discharge status: Home / Self Care | Payer: BC Managed Care – PPO | Source: Ambulatory Visit | Attending: Endocrinology | Admitting: Endocrinology

## 2022-05-30 DIAGNOSIS — E041 Nontoxic single thyroid nodule: Secondary | ICD-10-CM

## 2022-06-13 ENCOUNTER — Encounter: Payer: Self-pay | Admitting: Family

## 2022-06-13 ENCOUNTER — Ambulatory Visit: Payer: BC Managed Care – PPO | Admitting: Family

## 2022-06-13 VITALS — BP 148/94 | HR 88 | Temp 97.9°F | Ht 61.0 in | Wt 141.0 lb

## 2022-06-13 DIAGNOSIS — Z23 Encounter for immunization: Secondary | ICD-10-CM

## 2022-06-13 DIAGNOSIS — M810 Age-related osteoporosis without current pathological fracture: Secondary | ICD-10-CM

## 2022-06-13 DIAGNOSIS — N951 Menopausal and female climacteric states: Secondary | ICD-10-CM | POA: Diagnosis not present

## 2022-06-13 DIAGNOSIS — Z1211 Encounter for screening for malignant neoplasm of colon: Secondary | ICD-10-CM

## 2022-06-13 DIAGNOSIS — I10 Essential (primary) hypertension: Secondary | ICD-10-CM | POA: Diagnosis not present

## 2022-06-13 MED ORDER — VALSARTAN 80 MG PO TABS: 80.0000 mg | ORAL_TABLET | Freq: Every day | ORAL | 0 refills | Status: DC

## 2022-06-13 MED ORDER — TRAZODONE HCL 50 MG PO TABS: ORAL_TABLET | ORAL | 3 refills | Status: DC

## 2022-06-13 NOTE — Progress Notes (Signed)
Sandra Ball is a 61 y.o. female with the following history as recorded in EpicCare:  Patient Active Problem List   Diagnosis Date Noted   Localized osteoporosis without current pathological fracture 02/19/2022   Prediabetes 07/15/2021   Menopausal symptom 07/14/2020   Essential hypertension 04/20/2015   Hyperlipidemia with target LDL less than 130 06/26/2012    Current Outpatient Medications  Medication Sig Dispense Refill   alendronate (FOSAMAX) 70 MG tablet Take 70 mg by mouth once a week. Take with a full glass of water on an empty stomach.     atorvastatin (LIPITOR) 40 MG tablet Take 1 tablet (40 mg total) by mouth daily. 90 tablet 3   calcium carbonate (OSCAL) 1500 (600 Ca) MG TABS tablet Take by mouth 2 (two) times daily with a meal.     CINNAMON PO Take by mouth.     valsartan (DIOVAN) 80 MG tablet Take 1 tablet (80 mg total) by mouth daily. 90 tablet 0   venlafaxine XR (EFFEXOR-XR) 75 MG 24 hr capsule TAKE ONE CAPSULE DAILY WITH BREAKFAST 90 capsule 3   traZODone (DESYREL) 50 MG tablet TAKE 1 TABLET BY MOUTH AT BEDTIME AS NEEDED FOR SLEEP 90 tablet 3   No current facility-administered medications for this visit.    Allergies: Aleve [naproxen sodium]  Past Medical History:  Diagnosis Date   Hyperlipidemia    Hypertension    Ocular disease    Dr. Reino Bellis   Sleep apnea     Past Surgical History:  Procedure Laterality Date   AUGMENTATION MAMMAPLASTY     BREAST SURGERY     TUBAL LIGATION      Family History  Problem Relation Age of Onset   Hypertension Mother    Cancer Mother        lung   Hypertension Father    Heart disease Father     Social History   Tobacco Use   Smoking status: Never   Smokeless tobacco: Never  Substance Use Topics   Alcohol use: No    Subjective:   Presents today as a new patient; transferring from provider in [place]; 1) Requesting referral to GI due to problems with hemorrhoids/ due for colonoscopy; 2) Would like to try different  blood pressure medication; does not like how she feels on potassium that she is supposed to take with the HCTZ; would like to discuss other options; Denies any chest pain, shortness of breath, blurred vision or headache     Objective:  Vitals:   06/13/22 1503  BP: (!) 148/94  Pulse: 88  Temp: 97.9 F (36.6 C)  TempSrc: Oral  SpO2: 99%  Weight: 141 lb (64 kg)  Height: '5\' 1"'$  (1.549 m)    General: Well developed, well nourished, in no acute distress  Skin : Warm and dry.  Head: Normocephalic and atraumatic  Lungs: Respirations unlabored; clear to auscultation bilaterally without wheeze, rales, rhonchi  CVS exam: normal rate and regular rhythm.  Abdomen: Soft; nontender; nondistended; normoactive bowel sounds; no masses or hepatosplenomegaly  Musculoskeletal: No deformities; no active joint inflammation  Extremities: No edema, cyanosis, clubbing  Vessels: Symmetric bilaterally  Neurologic: Alert and oriented; speech intact; face symmetrical; moves all extremities well; CNII-XII intact without focal deficit   Assessment:  1. Encounter for screening colonoscopy   2. Menopausal symptom   3. Need for immunization against influenza   4. Primary hypertension   5. Osteoporosis, unspecified osteoporosis type, unspecified pathological fracture presence     Plan:  Referral  updated to GI; Stable; continue Effexor XR; Flu shot given;  D/C HCTZ- change to Valsartan 80 mg daily; check BMP; Patient is under care of endocrinology;   Return for on/ after 07/16/2022 for CPE.  Orders Placed This Encounter  Procedures   Flu Vaccine QUAD 42moIM (Fluarix, Fluzone & Alfiuria Quad PF)   Basic Metabolic Panel (BMET)   Ambulatory referral to Gastroenterology    Referral Priority:   Routine    Referral Type:   Consultation    Referral Reason:   Specialty Services Required    Number of Visits Requested:   1    Requested Prescriptions   Signed Prescriptions Disp Refills   valsartan (DIOVAN) 80  MG tablet 90 tablet 0    Sig: Take 1 tablet (80 mg total) by mouth daily.   traZODone (DESYREL) 50 MG tablet 90 tablet 3    Sig: TAKE 1 TABLET BY MOUTH AT BEDTIME AS NEEDED FOR SLEEP

## 2022-06-14 LAB — BASIC METABOLIC PANEL
BUN: 14 mg/dL (ref 7–25)
CO2: 26 mmol/L (ref 20–32)
Calcium: 10.2 mg/dL (ref 8.6–10.4)
Chloride: 106 mmol/L (ref 98–110)
Creat: 0.81 mg/dL (ref 0.50–1.05)
Glucose, Bld: 128 mg/dL — ABNORMAL HIGH (ref 65–99)
Potassium: 3.6 mmol/L (ref 3.5–5.3)
Sodium: 142 mmol/L (ref 135–146)

## 2022-07-16 DIAGNOSIS — H43813 Vitreous degeneration, bilateral: Secondary | ICD-10-CM | POA: Diagnosis not present

## 2022-07-16 DIAGNOSIS — H353232 Exudative age-related macular degeneration, bilateral, with inactive choroidal neovascularization: Secondary | ICD-10-CM | POA: Diagnosis not present

## 2022-07-18 ENCOUNTER — Other Ambulatory Visit (HOSPITAL_COMMUNITY)
Admission: RE | Admit: 2022-07-18 | Discharge: 2022-07-18 | Disposition: A | Discharge status: Home / Self Care | Payer: BC Managed Care – PPO | Source: Ambulatory Visit | Attending: Family | Admitting: Family

## 2022-07-18 ENCOUNTER — Ambulatory Visit (INDEPENDENT_AMBULATORY_CARE_PROVIDER_SITE_OTHER): Payer: BC Managed Care – PPO | Admitting: Family

## 2022-07-18 VITALS — BP 152/88 | HR 75 | Temp 98.7°F | Resp 16 | Ht 61.5 in | Wt 131.0 lb

## 2022-07-18 DIAGNOSIS — Z0000 Encounter for general adult medical examination without abnormal findings: Secondary | ICD-10-CM

## 2022-07-18 DIAGNOSIS — R7309 Other abnormal glucose: Secondary | ICD-10-CM

## 2022-07-18 DIAGNOSIS — Z124 Encounter for screening for malignant neoplasm of cervix: Secondary | ICD-10-CM | POA: Diagnosis not present

## 2022-07-18 DIAGNOSIS — Z13220 Encounter for screening for lipoid disorders: Secondary | ICD-10-CM | POA: Diagnosis not present

## 2022-07-18 DIAGNOSIS — E049 Nontoxic goiter, unspecified: Secondary | ICD-10-CM

## 2022-07-18 DIAGNOSIS — I10 Essential (primary) hypertension: Secondary | ICD-10-CM

## 2022-07-18 LAB — CBC WITH DIFFERENTIAL/PLATELET
Basophils Absolute: 0 10*3/uL (ref 0.0–0.1)
Basophils Relative: 0.6 % (ref 0.0–3.0)
Eosinophils Absolute: 0.2 10*3/uL (ref 0.0–0.7)
Eosinophils Relative: 2.4 % (ref 0.0–5.0)
HCT: 37.6 % (ref 36.0–46.0)
Hemoglobin: 12.2 g/dL (ref 12.0–15.0)
Lymphocytes Relative: 32.8 % (ref 12.0–46.0)
Lymphs Abs: 2.1 10*3/uL (ref 0.7–4.0)
MCHC: 32.4 g/dL (ref 30.0–36.0)
MCV: 84.8 fl (ref 78.0–100.0)
Monocytes Absolute: 0.4 10*3/uL (ref 0.1–1.0)
Monocytes Relative: 6.6 % (ref 3.0–12.0)
Neutro Abs: 3.7 10*3/uL (ref 1.4–7.7)
Neutrophils Relative %: 57.6 % (ref 43.0–77.0)
Platelets: 188 10*3/uL (ref 150.0–400.0)
RBC: 4.43 Mil/uL (ref 3.87–5.11)
RDW: 13.9 % (ref 11.5–15.5)
WBC: 6.4 10*3/uL (ref 4.0–10.5)

## 2022-07-18 LAB — LIPID PANEL
Cholesterol: 178 mg/dL (ref 0–200)
HDL: 49.6 mg/dL (ref >39.00)
LDL Cholesterol: 101 mg/dL — ABNORMAL HIGH (ref 0–99)
NonHDL: 128.38
Total CHOL/HDL Ratio: 4
Triglycerides: 137 mg/dL (ref 0.0–149.0)
VLDL: 27.4 mg/dL (ref 0.0–40.0)

## 2022-07-18 LAB — COMPREHENSIVE METABOLIC PANEL
ALT: 20 U/L (ref 0–35)
AST: 22 U/L (ref 0–37)
Albumin: 4.6 g/dL (ref 3.5–5.2)
Alkaline Phosphatase: 75 U/L (ref 39–117)
BUN: 9 mg/dL (ref 6–23)
CO2: 28 mEq/L (ref 19–32)
Calcium: 9.6 mg/dL (ref 8.4–10.5)
Chloride: 105 mEq/L (ref 96–112)
Creatinine, Ser: 0.64 mg/dL (ref 0.40–1.20)
GFR: 95.2 mL/min (ref >60.00)
Glucose, Bld: 85 mg/dL (ref 70–99)
Potassium: 3.8 mEq/L (ref 3.5–5.1)
Sodium: 139 mEq/L (ref 135–145)
Total Bilirubin: 0.6 mg/dL (ref 0.2–1.2)
Total Protein: 7.5 g/dL (ref 6.0–8.3)

## 2022-07-18 LAB — HEMOGLOBIN A1C: Hgb A1c MFr Bld: 6.3 % (ref 4.6–6.5)

## 2022-07-18 MED ORDER — VALSARTAN 160 MG PO TABS
160.0000 mg | ORAL_TABLET | Freq: Every day | ORAL | 1 refills | Status: DC
Start: 2022-07-18 — End: 2022-10-17

## 2022-07-18 NOTE — Progress Notes (Signed)
Sandra Ball is a 61 y.o. female with the following history as recorded in EpicCare:  Patient Active Problem List   Diagnosis Date Noted   Localized osteoporosis without current pathological fracture 02/19/2022   Prediabetes 07/15/2021   Menopausal symptom 07/14/2020   Essential hypertension 04/20/2015   Hyperlipidemia with target LDL less than 130 06/26/2012    Current Outpatient Medications  Medication Sig Dispense Refill   alendronate (FOSAMAX) 70 MG tablet Take 70 mg by mouth once a week. Take with a full glass of water on an empty stomach.     atorvastatin (LIPITOR) 40 MG tablet Take 1 tablet (40 mg total) by mouth daily. 90 tablet 3   calcium carbonate (OSCAL) 1500 (600 Ca) MG TABS tablet Take by mouth 2 (two) times daily with a meal.     CINNAMON PO Take by mouth.     traZODone (DESYREL) 50 MG tablet TAKE 1 TABLET BY MOUTH AT BEDTIME AS NEEDED FOR SLEEP 90 tablet 3   venlafaxine XR (EFFEXOR-XR) 75 MG 24 hr capsule TAKE ONE CAPSULE DAILY WITH BREAKFAST 90 capsule 3   valsartan (DIOVAN) 160 MG tablet Take 1 tablet (160 mg total) by mouth daily. 90 tablet 1   No current facility-administered medications for this visit.    Allergies: Aleve [naproxen sodium]  Past Medical History:  Diagnosis Date   Hyperlipidemia    Hypertension    Ocular disease    Dr. Reino Bellis   Sleep apnea     Past Surgical History:  Procedure Laterality Date   AUGMENTATION MAMMAPLASTY     BREAST SURGERY     TUBAL LIGATION      Family History  Problem Relation Age of Onset   Hypertension Mother    Cancer Mother        lung   Hypertension Father    Heart disease Father     Social History   Tobacco Use   Smoking status: Never   Smokeless tobacco: Never  Substance Use Topics   Alcohol use: No    Subjective:  Accompanied by daughter; needs yearly CPE;  At last OV, blood pressure medication was changed from HCTZ to Diovan 80 mg; does like better than HCTZ; notes that blood pressure is  averaging in the high 140s when she checks at home; Denies any chest pain, shortness of breath, blurred vision or headache  Still in the process of getting scheduled for GI follow up;  Does see endocrine for management of osteoporosis and enlarged thryoid; thyroid ultrasound done 05/2022;   Review of Systems  Constitutional: Negative.   HENT: Negative.    Eyes: Negative.   Respiratory: Negative.    Cardiovascular: Negative.   Gastrointestinal: Negative.   Genitourinary: Negative.   Musculoskeletal: Negative.   Skin: Negative.   Neurological: Negative.   Endo/Heme/Allergies: Negative.   Psychiatric/Behavioral: Negative.        Objective:  Vitals:   07/18/22 1047  BP: (!) 152/88  Pulse: 75  Resp: 16  Temp: 98.7 F (37.1 C)  TempSrc: Oral  SpO2: 98%  Weight: 131 lb (59.4 kg)  Height: 5' 1.5" (1.562 m)    General: Well developed, well nourished, in no acute distress  Skin : Warm and dry.  Head: Normocephalic and atraumatic  Eyes: Sclera and conjunctiva clear; pupils round and reactive to light; extraocular movements intact  Ears: External normal; canals clear; tympanic membranes normal  Oropharynx: Pink, supple. No suspicious lesions  Neck: Supple with thyromegaly, adenopathy  Lungs: Respirations unlabored; clear  to auscultation bilaterally without wheeze, rales, rhonchi  CVS exam: normal rate and regular rhythm.  Abdomen: Soft; nontender; nondistended; normoactive bowel sounds; no masses or hepatosplenomegaly  Musculoskeletal: No deformities; no active joint inflammation  Extremities: No edema, cyanosis, clubbing  Vessels: Symmetric bilaterally  Neurologic: Alert and oriented; speech intact; face symmetrical; moves all extremities well; CNII-XII intact without focal deficit  Pelvic exam: normal external genitalia, vulva, vagina, cervix, uterus and adnexa.  Assessment:  1. PE (physical exam), annual   2. Cervical cancer screening   3. Lipid screening   4. Elevated  glucose   5. Enlarged thyroid gland   6. Essential hypertension     Plan:  Age appropriate preventive healthcare needs addressed; encouraged regular eye doctor and dental exams; encouraged regular exercise; will update labs and refills as needed today; follow-up to be determined; Thin Prep pap collected;  Thyroid being followed by endocrine;  Increase Diovan to 160 mg daily; return for blood pressure follow up in 3 months;   Return in about 3 months (around 10/18/2022).  Orders Placed This Encounter  Procedures   CBC with Differential/Platelet   Comp Met (CMET)   Lipid panel   Hemoglobin A1c    Requested Prescriptions   Signed Prescriptions Disp Refills   valsartan (DIOVAN) 160 MG tablet 90 tablet 1    Sig: Take 1 tablet (160 mg total) by mouth daily.

## 2022-07-19 ENCOUNTER — Other Ambulatory Visit: Payer: Self-pay | Admitting: Family

## 2022-07-22 LAB — CYTOLOGY - PAP
Comment: NEGATIVE
Diagnosis: NEGATIVE
High risk HPV: NEGATIVE

## 2022-07-30 ENCOUNTER — Encounter: Payer: Self-pay | Admitting: Family

## 2022-08-04 ENCOUNTER — Encounter: Payer: Self-pay | Admitting: Gastroenterology

## 2022-08-08 ENCOUNTER — Other Ambulatory Visit: Payer: Self-pay | Admitting: Family

## 2022-08-08 DIAGNOSIS — E010 Iodine-deficiency related diffuse (endemic) goiter: Secondary | ICD-10-CM

## 2022-08-08 DIAGNOSIS — M810 Age-related osteoporosis without current pathological fracture: Secondary | ICD-10-CM

## 2022-08-10 ENCOUNTER — Encounter: Payer: Self-pay | Admitting: Family

## 2022-08-11 ENCOUNTER — Other Ambulatory Visit: Payer: Self-pay | Admitting: Family

## 2022-08-11 DIAGNOSIS — I10 Essential (primary) hypertension: Secondary | ICD-10-CM

## 2022-08-11 MED ORDER — ATORVASTATIN CALCIUM 40 MG PO TABS: 40.0000 mg | ORAL_TABLET | Freq: Every day | ORAL | 3 refills | Status: DC

## 2022-09-09 ENCOUNTER — Other Ambulatory Visit: Payer: Self-pay | Admitting: Family

## 2022-09-12 ENCOUNTER — Ambulatory Visit: Payer: BC Managed Care – PPO | Admitting: Gastroenterology

## 2022-09-12 ENCOUNTER — Encounter: Payer: Self-pay | Admitting: Gastroenterology

## 2022-09-12 VITALS — BP 130/76 | HR 72 | Ht 61.0 in | Wt 134.2 lb

## 2022-09-12 DIAGNOSIS — R194 Change in bowel habit: Secondary | ICD-10-CM

## 2022-09-12 DIAGNOSIS — Z1211 Encounter for screening for malignant neoplasm of colon: Secondary | ICD-10-CM | POA: Diagnosis not present

## 2022-09-12 MED ORDER — NA SULFATE-K SULFATE-MG SULF 17.5-3.13-1.6 GM/177ML PO SOLN: 1.0000 | Freq: Once | ORAL | 0 refills | Status: AC

## 2022-09-12 NOTE — Progress Notes (Signed)
[facility] Consult Note:  History: Sandra Ball 09/12/2022  Referring provider: Marrian Salvage, [place]  Reason for consult/chief complaint: Colonoscopy (Discuss colonoscopy, possible hemorrhoids and constipation.  No brb or pain. )   Subjective  HPI: This is a 62 year old woman referred to [place] by primary care for constipation and consideration of screening colonoscopy.  She is a very pleasant 62 year old woman accompanied by her daughter reporting intermittent irregularity of bowel movements for an unclear period of time, though she denies abdominal pain or rectal bleeding.  For perhaps at least a few months she feels a protrusion of anorectal tissue with bowel movements but without pain.  Denies chronic upper digestive symptoms.   ROS:  Review of Systems Denies chest pain dyspnea or dysuria  Past Medical History: Past Medical History:  Diagnosis Date   Hyperlipidemia    Hypertension    Ocular disease    Dr. Reino Bellis   Sleep apnea      Past Surgical History: Past Surgical History:  Procedure Laterality Date   AUGMENTATION MAMMAPLASTY     BREAST SURGERY     TUBAL LIGATION       Family History: Family History  Problem Relation Age of Onset   Hypertension Mother    Cancer Mother        lung   Hypertension Father    Heart disease Father     Social History: Social History   Socioeconomic History   Marital status: Married    Spouse name: Not on file   Number of children: 3   Years of education: Not on file   Highest education level: Not on file  Occupational History   Not on file  Tobacco Use   Smoking status: Never   Smokeless tobacco: Never  Vaping Use   Vaping Use: Never used  Substance and Sexual Activity   Alcohol use: No   Drug use: No   Sexual activity: Yes    Birth control/protection: None    Comment: divorced; 1 partner in past yr  Other Topics Concern   Not on file  Social History Narrative   Married.    Works to [employer].   3 children.   Enjoys being outdoors in the garden.    Social Determinants of Health   Financial Resource Strain: Not on file  Food Insecurity: Not on file  Transportation Needs: Not on file  Physical Activity: Not on file  Stress: Not on file  Social Connections: Not on file    Allergies: Allergies  Allergen Reactions   Aleve [Naproxen Sodium]     Outpatient Meds: Current Outpatient Medications  Medication Sig Dispense Refill   alendronate (FOSAMAX) 70 MG tablet Take 70 mg by mouth once a week. Take with a full glass of water on an empty stomach.     atorvastatin (LIPITOR) 40 MG tablet Take 1 tablet (40 mg total) by mouth daily. 90 tablet 3   calcium carbonate (OSCAL) 1500 (600 Ca) MG TABS tablet Take by mouth 2 (two) times daily with a meal.     CINNAMON PO Take by mouth.     traZODone (DESYREL) 50 MG tablet TAKE 1 TABLET BY MOUTH AT BEDTIME AS NEEDED FOR SLEEP 90 tablet 3   valsartan (DIOVAN) 160 MG tablet Take 1 tablet (160 mg total) by mouth daily. 90 tablet 1   venlafaxine XR (EFFEXOR-XR) 75 MG 24 hr capsule Take 1 capsule (75 mg total) by mouth daily with breakfast. 90 capsule 1  No current facility-administered medications for this visit.      ___________________________________________________________________ Objective   Exam:  BP 130/76   Pulse 72   Ht '5\' 1"'$  (1.549 m)   Wt 134 lb 4 oz (60.9 kg)   BMI 25.37 kg/m  Wt Readings from Last 3 Encounters:  09/12/22 134 lb 4 oz (60.9 kg)  07/18/22 131 lb (59.4 kg)  06/13/22 141 lb (64 kg)  Daughter present for entire visit.  General: Well-appearing, very pleasant CV: Regular without murmur, no JVD, no peripheral edema Resp: clear to auscultation bilaterally, normal RR and effort noted GI: soft, no tenderness, with active bowel sounds. No guarding or palpable organomegaly noted. Skin; warm and dry, no rash or jaundice noted Neuro: awake, alert and oriented x 3. Normal gross motor  function and fluent speech Rectal exam deferred today.  Anorectal symptoms will be evaluated at time of colonoscopy. Labs:     Latest Ref Rng & Units 07/18/2022   11:25 AM 07/15/2021    2:32 PM 07/13/2020    4:23 PM  CBC  WBC 4.0 - 10.5 K/uL 6.4  7.5  7.4   Hemoglobin 12.0 - 15.0 g/dL 12.2  11.8  12.1   Hematocrit 36.0 - 46.0 % 37.6  35.7  36.4   Platelets 150.0 - 400.0 K/uL 188.0  196  200       Latest Ref Rng & Units 07/18/2022   11:25 AM 06/13/2022    3:48 PM 07/15/2021    2:32 PM  CMP  Glucose 70 - 99 mg/dL 85  128  85   BUN 6 - 23 mg/dL '9  14  11   '$ Creatinine 0.40 - 1.20 mg/dL 0.64  0.81  0.61   Sodium 135 - 145 mEq/L 139  142  139   Potassium 3.5 - 5.1 mEq/L 3.8  3.6  3.5   Chloride 96 - 112 mEq/L 105  106  103   CO2 19 - 32 mEq/L '28  26  25   '$ Calcium 8.4 - 10.5 mg/dL 9.6  10.2  10.3   Total Protein 6.0 - 8.3 g/dL 7.5   7.7   Total Bilirubin 0.2 - 1.2 mg/dL 0.6   0.7   Alkaline Phos 39 - 117 U/L 75     AST 0 - 37 U/L 22   29   ALT 0 - 35 U/L 20   35    Lab Results  Component Value Date   TSH 4.23 11/23/2020     Assessment: Encounter Diagnoses  Name Primary?   Altered bowel habits Yes   Special screening for malignant neoplasms, colon     Irregular bowel habits, and from speaking with her and her daughter it sounds like she gets insufficient dietary fiber.  Daughter describes her as diet as "meat and potatoes". Protrusion of anorectal tissue that sounds likely benign, possibly hemorrhoids or may be hypertrophied anal papilla. Average risk colorectal cancer, and reports a normal colonoscopy at [facility] over 10 years ago. Plan:  Screening colonoscopy scheduled.  She was agreeable after discussion of procedure and risks.  The benefits and risks of the planned procedure were described in detail with the patient or (when appropriate) their health care proxy.  Risks were outlined as including, but not limited to, bleeding, infection, perforation, adverse medication  reaction leading to cardiac or pulmonary decompensation, pancreatitis (if ERCP).  The limitation of incomplete mucosal visualization was also discussed.  No guarantees or warranties were given.   Thank you  for the courtesy of this consult.  Please call me with any questions or concerns.  Nelida Meuse III  CC: Referring provider noted above

## 2022-09-12 NOTE — Patient Instructions (Signed)
_______________________________________________________  If you are age 62 or older, your body mass index should be between 23-30. Your Body mass index is 25.37 kg/m. If this is out of the aforementioned range listed, please consider follow up with your Primary Care Provider.  If you are age 84 or younger, your body mass index should be between 19-25. Your Body mass index is 25.37 kg/m. If this is out of the aformentioned range listed, please consider follow up with your Primary Care Provider.   ________________________________________________________  The  GI providers would like to encourage you to use [facility] to communicate with providers for non-urgent requests or questions.  Due to long hold times on the telephone, sending your provider a message by [facility] may be a faster and more efficient way to get a response.  Please allow 48 business hours for a response.  Please remember that this is for non-urgent requests.  _______________________________________________________  Dennis Bast have been scheduled for a colonoscopy. Please follow written instructions given to you at your visit today.  Please pick up your prep supplies at the pharmacy within the next 1-3 days. If you use inhalers (even only as needed), please bring them with you on the day of your procedure.  Due to recent changes in healthcare laws, you may see the results of your imaging and laboratory studies on MyChart before your provider has had a chance to review them.  We understand that in some cases there may be results that are confusing or concerning to you. Not all laboratory results come back in the same time frame and the provider may be waiting for multiple results in order to interpret others.  Please give [place] 48 hours in order for your provider to thoroughly review all the results before contacting the office for clarification of your results.   It was a pleasure to see you today!  Thank you for trusting me with your  gastrointestinal care!

## 2022-09-19 ENCOUNTER — Other Ambulatory Visit: Payer: Self-pay | Admitting: Family

## 2022-10-17 ENCOUNTER — Ambulatory Visit: Payer: BC Managed Care – PPO | Admitting: Family

## 2022-10-17 ENCOUNTER — Encounter: Payer: Self-pay | Admitting: Family

## 2022-10-17 VITALS — BP 138/86 | HR 82 | Resp 18 | Ht 61.0 in | Wt 136.2 lb

## 2022-10-17 DIAGNOSIS — J309 Allergic rhinitis, unspecified: Secondary | ICD-10-CM | POA: Diagnosis not present

## 2022-10-17 DIAGNOSIS — I10 Essential (primary) hypertension: Secondary | ICD-10-CM | POA: Diagnosis not present

## 2022-10-17 DIAGNOSIS — N951 Menopausal and female climacteric states: Secondary | ICD-10-CM

## 2022-10-17 MED ORDER — VALSARTAN 160 MG PO TABS
160.0000 mg | ORAL_TABLET | Freq: Every day | ORAL | 3 refills | Status: DC
Start: 2022-10-17 — End: 2023-08-14

## 2022-10-17 MED ORDER — CETIRIZINE HCL 10 MG PO TABS: 10.0000 mg | ORAL_TABLET | Freq: Every day | ORAL | 11 refills | Status: DC

## 2022-10-17 MED ORDER — FLUTICASONE PROPIONATE 50 MCG/ACT NA SUSP: 2.0000 | Freq: Every day | NASAL | 6 refills | Status: AC

## 2022-10-17 MED ORDER — VENLAFAXINE HCL ER 75 MG PO CP24
75.0000 mg | ORAL_CAPSULE | Freq: Every day | ORAL | 3 refills | Status: DC
Start: 2022-10-17 — End: 2023-09-18

## 2022-10-17 NOTE — Progress Notes (Signed)
Sandra Ball is a 62 y.o. female with the following history as recorded in EpicCare:  Patient Active Problem List   Diagnosis Date Noted   Localized osteoporosis without current pathological fracture 02/19/2022   Prediabetes 07/15/2021   Menopausal symptom 07/14/2020   Essential hypertension 04/20/2015   Hyperlipidemia with target LDL less than 130 06/26/2012    Current Outpatient Medications  Medication Sig Dispense Refill   alendronate (FOSAMAX) 70 MG tablet Take 70 mg by mouth once a week. Take with a full glass of water on an empty stomach.     atorvastatin (LIPITOR) 40 MG tablet Take 1 tablet (40 mg total) by mouth daily. 90 tablet 3   calcium carbonate (OSCAL) 1500 (600 Ca) MG TABS tablet Take by mouth 2 (two) times daily with a meal.     cetirizine (ZYRTEC) 10 MG tablet Take 1 tablet (10 mg total) by mouth daily. 30 tablet 11   CINNAMON PO Take by mouth.     fluticasone (FLONASE) 50 MCG/ACT nasal spray Place 2 sprays into both nostrils daily. 16 g 6   traZODone (DESYREL) 50 MG tablet TAKE 1 TABLET BY MOUTH AT BEDTIME AS NEEDED FOR SLEEP 90 tablet 3   valsartan (DIOVAN) 160 MG tablet Take 1 tablet (160 mg total) by mouth daily. 90 tablet 3   venlafaxine XR (EFFEXOR-XR) 75 MG 24 hr capsule Take 1 capsule (75 mg total) by mouth daily with breakfast. 90 capsule 3   No current facility-administered medications for this visit.    Allergies: Aleve [naproxen sodium]  Past Medical History:  Diagnosis Date   Hyperlipidemia    Hypertension    Ocular disease    Dr. Reino Bellis   Sleep apnea     Past Surgical History:  Procedure Laterality Date   AUGMENTATION MAMMAPLASTY     BREAST SURGERY     TUBAL LIGATION      Family History  Problem Relation Age of Onset   Hypertension Mother    Cancer Mother        lung   Hypertension Father    Heart disease Father     Social History   Tobacco Use   Smoking status: Never   Smokeless tobacco: Never  Substance Use Topics   Alcohol  use: No    Subjective:   Presents for 3 month follow up on hypertension- at last OV, Diovan was increased to 160 mg; has been feeling better with increased dosage; home readings are consistent with what is seen today;  Has had "tickly cough" x 1-2 weeks- seemed to respond to OTC allergy medication.   Objective:  Vitals:   10/17/22 0915  BP: 138/86  Pulse: 82  Resp: 18  SpO2: 100%  Weight: 136 lb 3.2 oz (61.8 kg)  Height: 5' 1"$  (1.549 m)    General: Well developed, well nourished, in no acute distress  Skin : Warm and dry.  Head: Normocephalic and atraumatic  Eyes: Sclera and conjunctiva clear; pupils round and reactive to light; extraocular movements intact  Ears: External normal; canals clear; tympanic membranes normal  Oropharynx: Pink, supple. No suspicious lesions  Neck: Supple without thyromegaly, adenopathy  Lungs: Respirations unlabored; clear to auscultation bilaterally without wheeze, rales, rhonchi  CVS exam: normal rate and regular rhythm.  Neurologic: Alert and oriented; speech intact; face symmetrical; moves all extremities well; CNII-XII intact without focal  deficit   Assessment:  1. Essential hypertension   2. Allergic rhinitis, unspecified seasonality, unspecified trigger   3. Menopausal symptom  Plan:  Improved; continue Diovan 160 mg; refill updated; follow up in 6 months;  Trial of Flonase and Zyrtec; follow up worse, no better.  Stable; refill updated on Effexor XR;   Return in about 6 months (around 04/17/2023).  No orders of the defined types were placed in this encounter.   Requested Prescriptions   Signed Prescriptions Disp Refills   cetirizine (ZYRTEC) 10 MG tablet 30 tablet 11    Sig: Take 1 tablet (10 mg total) by mouth daily.   fluticasone (FLONASE) 50 MCG/ACT nasal spray 16 g 6    Sig: Place 2 sprays into both nostrils daily.   valsartan (DIOVAN) 160 MG tablet 90 tablet 3    Sig: Take 1 tablet (160 mg total) by mouth daily.    venlafaxine XR (EFFEXOR-XR) 75 MG 24 hr capsule 90 capsule 3    Sig: Take 1 capsule (75 mg total) by mouth daily with breakfast.

## 2022-10-23 ENCOUNTER — Encounter: Payer: Self-pay | Admitting: Gastroenterology

## 2022-10-30 ENCOUNTER — Encounter: Payer: Self-pay | Admitting: Certified Registered Nurse Anesthetist

## 2022-10-31 ENCOUNTER — Ambulatory Visit (AMBULATORY_SURGERY_CENTER): Payer: BC Managed Care – PPO | Admitting: Gastroenterology

## 2022-10-31 ENCOUNTER — Encounter: Payer: Self-pay | Admitting: Gastroenterology

## 2022-10-31 VITALS — BP 134/78 | HR 67 | Temp 97.3°F | Resp 18 | Ht 61.0 in | Wt 134.0 lb

## 2022-10-31 DIAGNOSIS — Z1211 Encounter for screening for malignant neoplasm of colon: Secondary | ICD-10-CM

## 2022-10-31 DIAGNOSIS — D122 Benign neoplasm of ascending colon: Secondary | ICD-10-CM

## 2022-10-31 MED ORDER — SODIUM CHLORIDE 0.9 % IV SOLN: 500.0000 mL | Freq: Once | INTRAVENOUS | Status: DC

## 2022-10-31 NOTE — Patient Instructions (Signed)
Resume previous diet and medications. Awaiting pathology results. Repeat Colonoscopy date to be determined based on pathology results. Handouts provided on Colon polyps, Diverticulosis and Hemorrhoids.  YOU HAD AN ENDOSCOPIC PROCEDURE TODAY AT [facility]:   Refer to the procedure report that was given to you for any specific questions about what was found during the examination.  If the procedure report does not answer your questions, please call your gastroenterologist to clarify.  If you requested that your care partner not be given the details of your procedure findings, then the procedure report has been included in a sealed envelope for you to review at your convenience later.  YOU SHOULD EXPECT: Some feelings of bloating in the abdomen. Passage of more gas than usual.  Walking can help get rid of the air that was put into your GI tract during the procedure and reduce the bloating. If you had a lower endoscopy (such as a colonoscopy or flexible sigmoidoscopy) you may notice spotting of blood in your stool or on the toilet paper. If you underwent a bowel prep for your procedure, you may not have a normal bowel movement for a few days.  Please Note:  You might notice some irritation and congestion in your nose or some drainage.  This is from the oxygen used during your procedure.  There is no need for concern and it should clear up in a day or so.  SYMPTOMS TO REPORT IMMEDIATELY:  Following lower endoscopy (colonoscopy or flexible sigmoidoscopy):  Excessive amounts of blood in the stool  Significant tenderness or worsening of abdominal pains  Swelling of the abdomen that is new, acute  Fever of 100F or higher  For urgent or emergent issues, a gastroenterologist can be reached at any hour by calling [phone]. Do not use MyChart messaging for urgent concerns.    DIET:  We do recommend a small meal at first, but then you may proceed to your regular diet.  Drink plenty of  fluids but you should avoid alcoholic beverages for 24 hours.  ACTIVITY:  You should plan to take it easy for the rest of today and you should NOT DRIVE or use heavy machinery until tomorrow (because of the sedation medicines used during the test).    FOLLOW UP: Our staff will call the number listed on your records the next business day following your procedure.  We will call around 7:15- 8:00 am to check on you and address any questions or concerns that you may have regarding the information given to you following your procedure. If we do not reach you, we will leave a message.     If any biopsies were taken you will be contacted by phone or by letter within the next 1-3 weeks.  Please call us at [phone] if you have not heard about the biopsies in 3 weeks.    SIGNATURES/CONFIDENTIALITY: You and/or your care partner have signed paperwork which will be entered into your electronic medical record.  These signatures attest to the fact that that the information above on your After Visit Summary has been reviewed and is understood.  Full responsibility of the confidentiality of this discharge information lies with you and/or your care-partner.

## 2022-10-31 NOTE — Op Note (Signed)
[facility] Patient Name: Sandra Ball Procedure Date: 10/31/2022 1:10 PM MRN: [id number] Endoscopist: Mallie Mussel L. Loletha Carrow , MD, [id number] Age: 62 Referring MD:  Date of Birth: Apr 26, 1961 Gender: Female Account #: [id number] Procedure:                Colonoscopy Indications:              Screening for colorectal malignant neoplasm, This                            is the patient's first colonoscopy Medicines:                Monitored Anesthesia Care Procedure:                Pre-Anesthesia Assessment:                           - Prior to the procedure, a History and Physical                            was performed, and patient medications and                            allergies were reviewed. The patient's tolerance of                            previous anesthesia was also reviewed. The risks                            and benefits of the procedure and the sedation                            options and risks were discussed with the patient.                            All questions were answered, and informed consent                            was obtained. Prior Anticoagulants: The patient has                            taken no anticoagulant or antiplatelet agents. ASA                            Grade Assessment: II - A patient with mild systemic                            disease. After reviewing the risks and benefits,                            the patient was deemed in satisfactory condition to                            undergo the procedure.  After obtaining informed consent, the colonoscope                            was passed under direct vision. Throughout the                            procedure, the patient's blood pressure, pulse, and                            oxygen saturations were monitored continuously. The                            CF [id number] was introduced through the anus                            and advanced to the the  terminal ileum, with                            identification of the appendiceal orifice and IC                            valve. The colonoscopy was performed without                            difficulty. The patient tolerated the procedure                            well. The quality of the bowel preparation was                            good. The terminal ileum, ileocecal valve,                            appendiceal orifice, and rectum were photographed. Scope In: 1:18:25 PM Scope Out: 1:34:33 PM Scope Withdrawal Time: 0 hours 12 minutes 2 seconds  Total Procedure Duration: 0 hours 16 minutes 8 seconds  Findings:                 The perianal and digital rectal examinations were                            normal.                           The terminal ileum appeared normal.                           Repeat examination of right colon under NBI                            performed.                           An 8 mm polyp was found in the ascending colon. The  polyp was semi-pedunculated. The polyp was removed                            with a cold snare. Resection and retrieval were                            complete.                           Multiple diverticula were found in the entire colon.                           Internal hemorrhoids were found.                           The exam was otherwise without abnormality on                            direct and retroflexion views. Complications:            No immediate complications. Estimated Blood Loss:     Estimated blood loss was minimal. Impression:               - The examined portion of the ileum was normal.                           - One 8 mm polyp in the ascending colon, removed                            with a cold snare. Resected and retrieved.                           - Diverticulosis in the entire examined colon.                           - Internal hemorrhoids.                           -  The examination was otherwise normal on direct                            and retroflexion views. Recommendation:           - Patient has a contact number available for                            emergencies. The signs and symptoms of potential                            delayed complications were discussed with the                            patient. Return to normal activities tomorrow.                            Written discharge instructions were provided to the  patient.                           - Resume previous diet.                           - Continue present medications.                           - Await pathology results.                           - Repeat colonoscopy is recommended for                            surveillance. The colonoscopy date will be                            determined after pathology results from today's                            exam become available for review. Isadora Delorey L. Loletha Carrow, MD 10/31/2022 1:38:34 PM This report has been signed electronically.

## 2022-10-31 NOTE — Progress Notes (Signed)
Report given to PACU, vss 

## 2022-10-31 NOTE — Progress Notes (Signed)
History and Physical:  This patient presents for endoscopic testing for: Encounter Diagnosis  Name Primary?   Special screening for malignant neoplasms, colon Yes    Add'l clinical details in 09/12/22 office note  Patient is otherwise without complaints or active issues today.   Past Medical History: Past Medical History:  Diagnosis Date   Allergy    Hyperlipidemia    Hypertension    Ocular disease    Dr. Reino Bellis   Osteoporosis    Sleep apnea    no CPAP     Past Surgical History: Past Surgical History:  Procedure Laterality Date   AUGMENTATION MAMMAPLASTY     BREAST SURGERY     COLONOSCOPY     TUBAL LIGATION      Allergies: Allergies  Allergen Reactions   Aleve [Naproxen Sodium]     Affected kidneys    Outpatient Meds: Current Outpatient Medications  Medication Sig Dispense Refill   atorvastatin (LIPITOR) 40 MG tablet Take 1 tablet (40 mg total) by mouth daily. 90 tablet 3   calcium carbonate (OSCAL) 1500 (600 Ca) MG TABS tablet Take by mouth 2 (two) times daily with a meal.     CINNAMON PO Take by mouth.     ibandronate (BONIVA) 150 MG tablet every 30 (thirty) days.     valsartan (DIOVAN) 160 MG tablet Take 1 tablet (160 mg total) by mouth daily. 90 tablet 3   venlafaxine XR (EFFEXOR-XR) 75 MG 24 hr capsule Take 1 capsule (75 mg total) by mouth daily with breakfast. 90 capsule 3   cetirizine (ZYRTEC) 10 MG tablet Take 1 tablet (10 mg total) by mouth daily. 30 tablet 11   fluticasone (FLONASE) 50 MCG/ACT nasal spray Place 2 sprays into both nostrils daily. (Patient not taking: Reported on 10/31/2022) 16 g 6   traZODone (DESYREL) 50 MG tablet TAKE 1 TABLET BY MOUTH AT BEDTIME AS NEEDED FOR SLEEP 90 tablet 3   Current Facility-Administered Medications  Medication Dose Route Frequency Provider Last Rate Last Admin   0.9 %  sodium chloride infusion  500 mL Intravenous Once Danis, Kirke Corin, MD           ___________________________________________________________________ Objective   Exam:  BP (!) 141/92   Pulse 68   Temp (!) 97.3 F (36.3 C) (Skin)   Ht '5\' 1"'$  (1.549 m)   Wt 134 lb (60.8 kg)   SpO2 99%   BMI 25.32 kg/m   CV: regular , S1/S2 Resp: clear to auscultation bilaterally, normal RR and effort noted GI: soft, no tenderness, with active bowel sounds.   Assessment: Encounter Diagnosis  Name Primary?   Special screening for malignant neoplasms, colon Yes     Plan: Colonoscopy   The patient is appropriate for an endoscopic procedure in the ambulatory setting.   - Wilfrid Lund, MD

## 2022-10-31 NOTE — Progress Notes (Signed)
Called to room to assist during endoscopic procedure.  Patient ID and intended procedure confirmed with present staff. Received instructions for my participation in the procedure from the performing physician.  

## 2022-11-03 ENCOUNTER — Telehealth: Payer: Self-pay | Admitting: *Deleted

## 2022-11-03 NOTE — Telephone Encounter (Signed)
Left message on f/u call 

## 2022-11-05 ENCOUNTER — Encounter: Payer: Self-pay | Admitting: Gastroenterology

## 2023-03-11 DIAGNOSIS — H35373 Puckering of macula, bilateral: Secondary | ICD-10-CM | POA: Diagnosis not present

## 2023-03-11 DIAGNOSIS — H353232 Exudative age-related macular degeneration, bilateral, with inactive choroidal neovascularization: Secondary | ICD-10-CM | POA: Diagnosis not present

## 2023-03-11 DIAGNOSIS — H43813 Vitreous degeneration, bilateral: Secondary | ICD-10-CM | POA: Diagnosis not present

## 2023-03-13 ENCOUNTER — Ambulatory Visit: Payer: BC Managed Care – PPO | Admitting: Family

## 2023-03-13 VITALS — BP 130/82 | HR 74 | Temp 97.8°F | Resp 18 | Ht 61.0 in | Wt 135.2 lb

## 2023-03-13 DIAGNOSIS — R7303 Prediabetes: Secondary | ICD-10-CM | POA: Diagnosis not present

## 2023-03-13 DIAGNOSIS — Z1231 Encounter for screening mammogram for malignant neoplasm of breast: Secondary | ICD-10-CM | POA: Diagnosis not present

## 2023-03-13 DIAGNOSIS — M816 Localized osteoporosis [Lequesne]: Secondary | ICD-10-CM

## 2023-03-13 DIAGNOSIS — I10 Essential (primary) hypertension: Secondary | ICD-10-CM | POA: Diagnosis not present

## 2023-03-13 LAB — COMPREHENSIVE METABOLIC PANEL
ALT: 26 U/L (ref 0–35)
AST: 27 U/L (ref 0–37)
Albumin: 4.3 g/dL (ref 3.5–5.2)
Alkaline Phosphatase: 74 U/L (ref 39–117)
BUN: 8 mg/dL (ref 6–23)
CO2: 27 mEq/L (ref 19–32)
Calcium: 9.9 mg/dL (ref 8.4–10.5)
Chloride: 104 mEq/L (ref 96–112)
Creatinine, Ser: 0.7 mg/dL (ref 0.40–1.20)
GFR: 92.74 mL/min (ref >60.00)
Glucose, Bld: 86 mg/dL (ref 70–99)
Potassium: 4.5 mEq/L (ref 3.5–5.1)
Sodium: 139 mEq/L (ref 135–145)
Total Bilirubin: 0.5 mg/dL (ref 0.2–1.2)
Total Protein: 7.4 g/dL (ref 6.0–8.3)

## 2023-03-13 LAB — HEMOGLOBIN A1C: Hgb A1c MFr Bld: 6 % (ref 4.6–6.5)

## 2023-03-13 NOTE — Patient Instructions (Signed)
Please consider getting your Shingles vaccine at your pharmacy;

## 2023-03-13 NOTE — Progress Notes (Signed)
Sandra Ball is a 62 y.o. female with the following history as recorded in EpicCare:  Patient Active Problem List   Diagnosis Date Noted   Localized osteoporosis without current pathological fracture 02/19/2022   Prediabetes 07/15/2021   Menopausal symptom 07/14/2020   Essential hypertension 04/20/2015   Hyperlipidemia with target LDL less than 130 06/26/2012    Current Outpatient Medications  Medication Sig Dispense Refill   atorvastatin (LIPITOR) 40 MG tablet Take 1 tablet (40 mg total) by mouth daily. 90 tablet 3   calcium carbonate (OSCAL) 1500 (600 Ca) MG TABS tablet Take by mouth 2 (two) times daily with a meal.     cetirizine (ZYRTEC) 10 MG tablet Take 1 tablet (10 mg total) by mouth daily. 30 tablet 11   CINNAMON PO Take by mouth.     fluticasone (FLONASE) 50 MCG/ACT nasal spray Place 2 sprays into both nostrils daily. 16 g 6   ibandronate (BONIVA) 150 MG tablet every 30 (thirty) days.     traZODone (DESYREL) 50 MG tablet TAKE 1 TABLET BY MOUTH AT BEDTIME AS NEEDED FOR SLEEP 90 tablet 3   valsartan (DIOVAN) 160 MG tablet Take 1 tablet (160 mg total) by mouth daily. 90 tablet 3   venlafaxine XR (EFFEXOR-XR) 75 MG 24 hr capsule Take 1 capsule (75 mg total) by mouth daily with breakfast. 90 capsule 3   No current facility-administered medications for this visit.    Allergies: Aleve [naproxen sodium]  Past Medical History:  Diagnosis Date   Allergy    Hyperlipidemia    Hypertension    Ocular disease    Dr. Berton Mount   Osteoporosis    Sleep apnea    no CPAP    Past Surgical History:  Procedure Laterality Date   AUGMENTATION MAMMAPLASTY     BREAST SURGERY     COLONOSCOPY     TUBAL LIGATION      Family History  Problem Relation Age of Onset   Hypertension Mother    Cancer Mother        lung   Hypertension Father    Heart disease Father    Colon cancer Neg Hx    Esophageal cancer Neg Hx    Rectal cancer Neg Hx    Stomach cancer Neg Hx     Social History    Tobacco Use   Smoking status: Never   Smokeless tobacco: Never  Substance Use Topics   Alcohol use: No    Subjective:   6 month follow up on chronic care needs; will be retiring in the next 2 months and wants to make sure "everything is up to date." Denies any chest pain, shortness of breath, blurred vision or headache    Objective:  Vitals:   03/13/23 0907  BP: 130/82  Pulse: 74  Resp: 18  Temp: 97.8 F (36.6 C)  TempSrc: Temporal  SpO2: 98%  Weight: 135 lb 3.2 oz (61.3 kg)  Height: 5\' 1"  (1.549 m)    General: Well developed, well nourished, in no acute distress  Skin : Warm and dry.  Head: Normocephalic and atraumatic  Eyes: Sclera and conjunctiva clear; pupils round and reactive to light; extraocular movements intact  Ears: External normal; canals clear; tympanic membranes normal  Oropharynx: Pink, supple. No suspicious lesions  Neck: Supple without thyromegaly, adenopathy  Lungs: Respirations unlabored; clear to auscultation bilaterally without wheeze, rales, rhonchi  CVS exam: normal rate and regular rhythm.  Neurologic: Alert and oriented; speech intact; face symmetrical; moves all  extremities well; CNII-XII intact without focal deficit   Assessment:  1. Pre-diabetes   2. Visit for screening mammogram   3. Essential hypertension   4. Localized osteoporosis without current pathological fracture     Plan:  Update labs today; Order updated; Stable; continue same medication; Will be establishing with endocrine later this year per patient request;   No follow-ups on file.  Orders Placed This Encounter  Procedures   MM 3D SCREENING MAMMOGRAM BILATERAL BREAST    Standing Status:   Future    Standing Expiration Date:   03/12/2024    Order Specific Question:   Reason for Exam (SYMPTOM  OR DIAGNOSIS REQUIRED)    Answer:   screening mammogram    Order Specific Question:   Preferred imaging location?    Answer:   [facility]   Comp Met (CMET)    Hemoglobin A1c    Requested Prescriptions    No prescriptions requested or ordered in this encounter

## 2023-03-19 ENCOUNTER — Other Ambulatory Visit: Payer: Self-pay | Admitting: Family

## 2023-03-19 ENCOUNTER — Ambulatory Visit (HOSPITAL_BASED_OUTPATIENT_CLINIC_OR_DEPARTMENT_OTHER)
Admission: RE | Admit: 2023-03-19 | Discharge: 2023-03-19 | Disposition: A | Discharge status: Home / Self Care | Payer: BC Managed Care – PPO | Source: Ambulatory Visit | Attending: Family | Admitting: Family

## 2023-03-19 DIAGNOSIS — Z1231 Encounter for screening mammogram for malignant neoplasm of breast: Secondary | ICD-10-CM

## 2023-03-19 DIAGNOSIS — I10 Essential (primary) hypertension: Secondary | ICD-10-CM

## 2023-03-19 DIAGNOSIS — R7303 Prediabetes: Secondary | ICD-10-CM

## 2023-03-19 DIAGNOSIS — M816 Localized osteoporosis [Lequesne]: Secondary | ICD-10-CM

## 2023-04-17 ENCOUNTER — Ambulatory Visit: Payer: BC Managed Care – PPO | Admitting: Family

## 2023-07-10 ENCOUNTER — Ambulatory Visit: Payer: BC Managed Care – PPO | Admitting: Internal Medicine

## 2023-08-11 ENCOUNTER — Other Ambulatory Visit: Payer: Self-pay | Admitting: Family

## 2023-08-11 DIAGNOSIS — N951 Menopausal and female climacteric states: Secondary | ICD-10-CM

## 2023-08-12 ENCOUNTER — Other Ambulatory Visit: Payer: Self-pay | Admitting: Family

## 2023-08-12 DIAGNOSIS — I10 Essential (primary) hypertension: Secondary | ICD-10-CM

## 2023-08-14 ENCOUNTER — Encounter: Payer: Self-pay | Admitting: Family

## 2023-08-14 ENCOUNTER — Ambulatory Visit: Payer: 59 | Admitting: Family

## 2023-08-14 VITALS — BP 138/90 | HR 71 | Resp 16 | Ht 61.0 in | Wt 141.8 lb

## 2023-08-14 DIAGNOSIS — Z23 Encounter for immunization: Secondary | ICD-10-CM

## 2023-08-14 DIAGNOSIS — E785 Hyperlipidemia, unspecified: Secondary | ICD-10-CM | POA: Diagnosis not present

## 2023-08-14 DIAGNOSIS — R7303 Prediabetes: Secondary | ICD-10-CM

## 2023-08-14 DIAGNOSIS — N951 Menopausal and female climacteric states: Secondary | ICD-10-CM

## 2023-08-14 DIAGNOSIS — Z0000 Encounter for general adult medical examination without abnormal findings: Secondary | ICD-10-CM

## 2023-08-14 DIAGNOSIS — I10 Essential (primary) hypertension: Secondary | ICD-10-CM | POA: Diagnosis not present

## 2023-08-14 LAB — COMPREHENSIVE METABOLIC PANEL
ALT: 51 U/L — ABNORMAL HIGH (ref 0–35)
AST: 33 U/L (ref 0–37)
Albumin: 4.5 g/dL (ref 3.5–5.2)
Alkaline Phosphatase: 102 U/L (ref 39–117)
BUN: 12 mg/dL (ref 6–23)
CO2: 31 meq/L (ref 19–32)
Calcium: 10.4 mg/dL (ref 8.4–10.5)
Chloride: 102 meq/L (ref 96–112)
Creatinine, Ser: 0.66 mg/dL (ref 0.40–1.20)
GFR: 93.79 mL/min (ref >60.00)
Glucose, Bld: 95 mg/dL (ref 70–99)
Potassium: 4.2 meq/L (ref 3.5–5.1)
Sodium: 139 meq/L (ref 135–145)
Total Bilirubin: 0.6 mg/dL (ref 0.2–1.2)
Total Protein: 7.7 g/dL (ref 6.0–8.3)

## 2023-08-14 LAB — LIPID PANEL
Cholesterol: 255 mg/dL — ABNORMAL HIGH (ref 0–200)
HDL: 45.1 mg/dL (ref >39.00)
LDL Cholesterol: 162 mg/dL — ABNORMAL HIGH (ref 0–99)
NonHDL: 210.33
Total CHOL/HDL Ratio: 6
Triglycerides: 240 mg/dL — ABNORMAL HIGH (ref 0.0–149.0)
VLDL: 48 mg/dL — ABNORMAL HIGH (ref 0.0–40.0)

## 2023-08-14 LAB — CBC WITH DIFFERENTIAL/PLATELET
Basophils Absolute: 0 10*3/uL (ref 0.0–0.1)
Basophils Relative: 0.7 % (ref 0.0–3.0)
Eosinophils Absolute: 0.2 10*3/uL (ref 0.0–0.7)
Eosinophils Relative: 2.7 % (ref 0.0–5.0)
HCT: 40 % (ref 36.0–46.0)
Hemoglobin: 13.5 g/dL (ref 12.0–15.0)
Lymphocytes Relative: 30.3 % (ref 12.0–46.0)
Lymphs Abs: 2.1 10*3/uL (ref 0.7–4.0)
MCHC: 33.8 g/dL (ref 30.0–36.0)
MCV: 87.4 fL (ref 78.0–100.0)
Monocytes Absolute: 0.5 10*3/uL (ref 0.1–1.0)
Monocytes Relative: 7.7 % (ref 3.0–12.0)
Neutro Abs: 4 10*3/uL (ref 1.4–7.7)
Neutrophils Relative %: 58.6 % (ref 43.0–77.0)
Platelets: 204 10*3/uL (ref 150.0–400.0)
RBC: 4.58 Mil/uL (ref 3.87–5.11)
RDW: 13.6 % (ref 11.5–15.5)
WBC: 6.8 10*3/uL (ref 4.0–10.5)

## 2023-08-14 LAB — HEMOGLOBIN A1C: Hgb A1c MFr Bld: 6.2 % (ref 4.6–6.5)

## 2023-08-14 MED ORDER — VALSARTAN-HYDROCHLOROTHIAZIDE 160-12.5 MG PO TABS
1.0000 | ORAL_TABLET | Freq: Every day | ORAL | 0 refills | Status: DC
Start: 2023-08-14 — End: 2023-09-18

## 2023-08-14 MED ORDER — TRAZODONE HCL 50 MG PO TABS
50.0000 mg | ORAL_TABLET | Freq: Every evening | ORAL | 0 refills | Status: DC | PRN
Start: 2023-08-14 — End: 2024-01-11

## 2023-08-14 MED ORDER — ATORVASTATIN CALCIUM 40 MG PO TABS
40.0000 mg | ORAL_TABLET | Freq: Every day | ORAL | 3 refills | Status: AC
Start: 2023-08-14 — End: ?

## 2023-08-14 NOTE — Progress Notes (Signed)
Sandra Ball is a 62 y.o. female with the following history as recorded in EpicCare:  Patient Active Problem List   Diagnosis Date Noted   Localized osteoporosis without current pathological fracture 02/19/2022   Prediabetes 07/15/2021   Menopausal symptom 07/14/2020   Essential hypertension 04/20/2015   Hyperlipidemia with target LDL less than 130 06/26/2012    Current Outpatient Medications  Medication Sig Dispense Refill   calcium carbonate (OSCAL) 1500 (600 Ca) MG TABS tablet Take by mouth 2 (two) times daily with a meal.     cetirizine (ZYRTEC) 10 MG tablet Take 1 tablet (10 mg total) by mouth daily. 30 tablet 11   CINNAMON PO Take by mouth.     fluticasone (FLONASE) 50 MCG/ACT nasal spray Place 2 sprays into both nostrils daily. 16 g 6   ibandronate (BONIVA) 150 MG tablet every 30 (thirty) days.     valsartan-hydrochlorothiazide (DIOVAN-HCT) 160-12.5 MG tablet Take 1 tablet by mouth daily. 90 tablet 0   venlafaxine XR (EFFEXOR-XR) 75 MG 24 hr capsule Take 1 capsule (75 mg total) by mouth daily with breakfast. 90 capsule 3   atorvastatin (LIPITOR) 40 MG tablet Take 1 tablet (40 mg total) by mouth daily. 90 tablet 3   traZODone (DESYREL) 50 MG tablet Take 1 tablet (50 mg total) by mouth at bedtime as needed for sleep. 90 tablet 0   No current facility-administered medications for this visit.    Allergies: Aleve [naproxen sodium]  Past Medical History:  Diagnosis Date   Allergy    Hyperlipidemia    Hypertension    Ocular disease    Dr. Berton Mount   Osteoporosis    Sleep apnea    no CPAP    Past Surgical History:  Procedure Laterality Date   AUGMENTATION MAMMAPLASTY     BREAST SURGERY     COLONOSCOPY     TUBAL LIGATION      Family History  Problem Relation Age of Onset   Hypertension Mother    Cancer Mother        lung   Hypertension Father    Heart disease Father    Colon cancer Neg Hx    Esophageal cancer Neg Hx    Rectal cancer Neg Hx    Stomach cancer Neg Hx      Social History   Tobacco Use   Smoking status: Never   Smokeless tobacco: Never  Substance Use Topics   Alcohol use: No    Subjective:   Presents for yearly CPE; no acute concerns today; surprised to see that blood pressure is elevated today- has retired in July and enjoying being home/ helping to take care of husband who has dementia;  Has established with endocrine for management of thyroid and bone density- Dr. Talmage Nap;  Review of Systems  Constitutional: Negative.   HENT: Negative.    Eyes: Negative.   Respiratory: Negative.    Cardiovascular: Negative.   Gastrointestinal: Negative.   Genitourinary: Negative.   Musculoskeletal: Negative.   Skin: Negative.   Neurological: Negative.   Endo/Heme/Allergies: Negative.   Psychiatric/Behavioral: Negative.         Objective:  Vitals:   08/14/23 0932 08/14/23 0954  BP: (!) 142/90 (!) 138/90  Pulse: 71   Resp: 16   SpO2: 97%   Weight: 141 lb 12.8 oz (64.3 kg)   Height: 5\' 1"  (1.549 m)     General: Well developed, well nourished, in no acute distress  Skin : Warm and dry.  Head: Normocephalic  and atraumatic  Eyes: Sclera and conjunctiva clear; pupils round and reactive to light; extraocular movements intact  Ears: External normal; canals clear; tympanic membranes normal  Oropharynx: Pink, supple. No suspicious lesions  Neck: Supple without thyromegaly, adenopathy  Lungs: Respirations unlabored; clear to auscultation bilaterally without wheeze, rales, rhonchi  CVS exam: normal rate and regular rhythm.  Abdomen: Soft; nontender; nondistended; normoactive bowel sounds; no masses or hepatosplenomegaly  Musculoskeletal: No deformities; no active joint inflammation  Extremities: No edema, cyanosis, clubbing  Vessels: Symmetric bilaterally  Neurologic: Alert and oriented; speech intact; face symmetrical; moves all extremities well; CNII-XII intact without focal deficit   Assessment:  1. PE (physical exam), annual   2.  Menopausal symptom   3. Essential hypertension   4. Hyperlipidemia with target LDL less than 130   5. Pre-diabetes     Plan:   Age appropriate preventive healthcare needs addressed; encouraged regular eye doctor and dental exams; encouraged regular exercise; will update labs and refills as needed today; Concerned that blood pressure is not well controlled- change to Diovan HCT 160/12.5 mg- plan to follow up in 5 weeks to re-check.     Return in about 5 weeks (around 09/18/2023) for follow up.  Orders Placed This Encounter  Procedures   CBC with Differential/Platelet   Comp Met (CMET)   Lipid panel   Hemoglobin A1c    Requested Prescriptions   Signed Prescriptions Disp Refills   traZODone (DESYREL) 50 MG tablet 90 tablet 0    Sig: Take 1 tablet (50 mg total) by mouth at bedtime as needed for sleep.   atorvastatin (LIPITOR) 40 MG tablet 90 tablet 3    Sig: Take 1 tablet (40 mg total) by mouth daily.   valsartan-hydrochlorothiazide (DIOVAN-HCT) 160-12.5 MG tablet 90 tablet 0    Sig: Take 1 tablet by mouth daily.

## 2023-09-13 ENCOUNTER — Other Ambulatory Visit: Payer: Self-pay | Admitting: Family

## 2023-09-18 ENCOUNTER — Encounter: Payer: Self-pay | Admitting: Family

## 2023-09-18 ENCOUNTER — Ambulatory Visit (INDEPENDENT_AMBULATORY_CARE_PROVIDER_SITE_OTHER): Payer: 59 | Admitting: Family

## 2023-09-18 VITALS — BP 126/78 | HR 81 | Ht 61.0 in | Wt 143.6 lb

## 2023-09-18 DIAGNOSIS — I10 Essential (primary) hypertension: Secondary | ICD-10-CM

## 2023-09-18 DIAGNOSIS — E785 Hyperlipidemia, unspecified: Secondary | ICD-10-CM

## 2023-09-18 DIAGNOSIS — Z636 Dependent relative needing care at home: Secondary | ICD-10-CM | POA: Diagnosis not present

## 2023-09-18 LAB — LIPID PANEL
Cholesterol: 218 mg/dL — ABNORMAL HIGH (ref 0–200)
HDL: 48.9 mg/dL (ref >39.00)
LDL Cholesterol: 134 mg/dL — ABNORMAL HIGH (ref 0–99)
NonHDL: 168.83
Total CHOL/HDL Ratio: 4
Triglycerides: 174 mg/dL — ABNORMAL HIGH (ref 0.0–149.0)
VLDL: 34.8 mg/dL (ref 0.0–40.0)

## 2023-09-18 LAB — COMPREHENSIVE METABOLIC PANEL
ALT: 34 U/L (ref 0–35)
AST: 30 U/L (ref 0–37)
Albumin: 4.5 g/dL (ref 3.5–5.2)
Alkaline Phosphatase: 96 U/L (ref 39–117)
BUN: 14 mg/dL (ref 6–23)
CO2: 28 meq/L (ref 19–32)
Calcium: 10.4 mg/dL (ref 8.4–10.5)
Chloride: 103 meq/L (ref 96–112)
Creatinine, Ser: 0.58 mg/dL (ref 0.40–1.20)
GFR: 96.69 mL/min (ref >60.00)
Glucose, Bld: 97 mg/dL (ref 70–99)
Potassium: 3.7 meq/L (ref 3.5–5.1)
Sodium: 139 meq/L (ref 135–145)
Total Bilirubin: 0.6 mg/dL (ref 0.2–1.2)
Total Protein: 7.4 g/dL (ref 6.0–8.3)

## 2023-09-18 MED ORDER — VALSARTAN-HYDROCHLOROTHIAZIDE 160-12.5 MG PO TABS: 1.0000 | ORAL_TABLET | Freq: Every day | ORAL | 3 refills | Status: DC

## 2023-09-18 MED ORDER — VENLAFAXINE HCL ER 75 MG PO CP24: 75.0000 mg | ORAL_CAPSULE | Freq: Every day | ORAL | 3 refills | Status: DC

## 2023-09-18 NOTE — Progress Notes (Signed)
 Sandra Ball is a 63 y.o. female with the following history as recorded in EpicCare:  Patient Active Problem List   Diagnosis Date Noted   Localized osteoporosis without current pathological fracture 02/19/2022   Prediabetes 07/15/2021   Menopausal symptom 07/14/2020   Essential hypertension 04/20/2015   Hyperlipidemia with target LDL less than 130 06/26/2012    Current Outpatient Medications  Medication Sig Dispense Refill   atorvastatin  (LIPITOR) 40 MG tablet Take 1 tablet (40 mg total) by mouth daily. 90 tablet 3   calcium  carbonate (OSCAL) 1500 (600 Ca) MG TABS tablet Take by mouth 2 (two) times daily with a meal.     cetirizine  (ZYRTEC ) 10 MG tablet TAKE 1 TABLET BY MOUTH EVERY DAY 90 tablet 3   CINNAMON PO Take by mouth.     fluticasone  (FLONASE ) 50 MCG/ACT nasal spray Place 2 sprays into both nostrils daily. 16 g 6   ibandronate  (BONIVA ) 150 MG tablet every 30 (thirty) days.     traZODone  (DESYREL ) 50 MG tablet Take 1 tablet (50 mg total) by mouth at bedtime as needed for sleep. 90 tablet 0   valsartan -hydrochlorothiazide  (DIOVAN -HCT) 160-12.5 MG tablet Take 1 tablet by mouth daily. 90 tablet 3   venlafaxine  XR (EFFEXOR -XR) 75 MG 24 hr capsule Take 1 capsule (75 mg total) by mouth daily with breakfast. 90 capsule 3   No current facility-administered medications for this visit.    Allergies: Aleve [naproxen sodium]  Past Medical History:  Diagnosis Date   Allergy    Hyperlipidemia    Hypertension    Ocular disease    Dr. Lillian   Osteoporosis    Sleep apnea    no CPAP    Past Surgical History:  Procedure Laterality Date   AUGMENTATION MAMMAPLASTY     BREAST SURGERY     COLONOSCOPY     TUBAL LIGATION      Family History  Problem Relation Age of Onset   Hypertension Mother    Cancer Mother        lung   Hypertension Father    Heart disease Father    Colon cancer Neg Hx    Esophageal cancer Neg Hx    Rectal cancer Neg Hx    Stomach cancer Neg Hx      Social History   Tobacco Use   Smoking status: Never   Smokeless tobacco: Never  Substance Use Topics   Alcohol use: No    Subjective:   Accompanied by daughter; 1 month follow up on hypertension- medication was adjusted at last OV; has been feeling better; has re-started her cholesterol medication as well; Denies any chest pain, shortness of breath, blurred vision or headache   Objective:  Vitals:   09/18/23 0943  BP: 126/78  Pulse: 81  SpO2: 97%  Weight: 143 lb 9.6 oz (65.1 kg)  Height: 5' 1 (1.549 m)    General: Well developed, well nourished, in no acute distress  Skin : Warm and dry.  Head: Normocephalic and atraumatic  Lungs: Respirations unlabored; clear to auscultation bilaterally without wheeze, rales, rhonchi  CVS exam: normal rate and regular rhythm.  Neurologic: Alert and oriented; speech intact; face symmetrical; moves all extremities well; CNII-XII intact without focal deficit   Assessment:  1. Hyperlipidemia with target LDL less than 130   2. Essential hypertension   3. 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   No follow-ups on file.  Orders Placed This Encounter  Procedures   Lipid panel   Comp Met (CMET)    Requested Prescriptions   Signed Prescriptions Disp Refills   valsartan -hydrochlorothiazide  (DIOVAN -HCT) 160-12.5 MG tablet 90 tablet 3    Sig: Take 1 tablet by mouth daily.   venlafaxine  XR (EFFEXOR -XR) 75 MG 24 hr capsule 90 capsule 3    Sig: Take 1 capsule (75 mg total) by mouth daily with breakfast.

## 2023-10-23 ENCOUNTER — Ambulatory Visit: Payer: BC Managed Care – PPO | Admitting: Internal Medicine

## 2023-10-29 NOTE — Progress Notes (Signed)
 Name: Sandra Ball  MRN/ DOB: [id number], Aug 17, 1961    Age/ Sex: 63 y.o., female    PCP: Olive Bass, FNP   Reason for Endocrinology Evaluation: Osteoporosis     Date of Initial Endocrinology Evaluation: 10/30/2023     HPI: Ms. Sandra Ball is a 63 y.o. female with a past medical history of Dyslipidemia,Osteoporosis and HTN . The patient presented for initial endocrinology clinic visit on 10/30/2023 for consultative assistance with her osteoporosis.   Pt was diagnosed with osteoporosis: in 2023 with a T-Score -3.5 at the AP spine   She is accompanied by her daughter today   Menarche at age : 42 Menopausal at age : 3 Fracture Hx: no  Hx of HRT: no  FH of osteoporosis or hip fracture: no  Prior Hx of anti-resorptive therapy : Boniva 04/2022   Oscal -D 500 mg BID Vitamin D daily  Transferred care from Dr. Talmage Nap     THYROMEGALY: She has been diagnosed with cystic right thyroid nodule on ultrasound from 2022 at 3.5 cm, repeat ultrasound 2023 did not show any significant change at 3.6 cm.  The nodule did not meet any criteria for follow-up or FNA    Has noted slight local neck swelling over a year ago No recent Palpitations  Has occasional heartburn  Denies constipation or diarrhea  Denies any tremors   NO glucocorticoid intake    Boniva 150 mg once monthly     HISTORY:  Past Medical History:  Past Medical History:  Diagnosis Date   Allergy    Hyperlipidemia    Hypertension    Ocular disease    Dr. Berton Mount   Osteoporosis    Sleep apnea    no CPAP   Past Surgical History:  Past Surgical History:  Procedure Laterality Date   AUGMENTATION MAMMAPLASTY     BREAST SURGERY     COLONOSCOPY     TUBAL LIGATION      Social History:  reports that she has never smoked. She has never used smokeless tobacco. She reports that she does not drink alcohol and does not use drugs. Family History: family history includes Cancer in her mother; Heart  disease in her father; Hypertension in her father and mother.   HOME MEDICATIONS: Allergies as of 10/30/2023       Reactions   Aleve [naproxen Sodium]    Affected kidneys        Medication List        Accurate as of October 30, 2023  8:57 AM. If you have any questions, ask your nurse or doctor.          atorvastatin 40 MG tablet Commonly known as: LIPITOR Take 1 tablet (40 mg total) by mouth daily.   calcium carbonate 1500 (600 Ca) MG Tabs tablet Commonly known as: OSCAL Take by mouth 2 (two) times daily with a meal.   cetirizine 10 MG tablet Commonly known as: ZYRTEC TAKE 1 TABLET BY MOUTH EVERY DAY   CINNAMON PO Take by mouth.   fluticasone 50 MCG/ACT nasal spray Commonly known as: FLONASE Place 2 sprays into both nostrils daily.   ibandronate 150 MG tablet Commonly known as: BONIVA Take 1 tablet (150 mg total) by mouth every 30 (thirty) days. What changed:  how much to take how to take this Changed by: Johnney Ou [place]ose   traZODone 50 MG tablet Commonly known as: DESYREL Take 1 tablet (50 mg total) by mouth at bedtime as needed  for sleep.   valsartan-hydrochlorothiazide 160-12.5 MG tablet Commonly known as: DIOVAN-HCT Take 1 tablet by mouth daily.   venlafaxine XR 75 MG 24 hr capsule Commonly known as: EFFEXOR-XR Take 1 capsule (75 mg total) by mouth daily with breakfast.          REVIEW OF SYSTEMS: A comprehensive ROS was conducted with the patient and is negative except as per HPI    OBJECTIVE:  VS: BP 118/74 (BP Location: Right Arm, Patient Position: Sitting, Cuff Size: Small)   Pulse 98   Ht 5\' 1"  (1.549 m)   Wt 146 lb (66.2 kg)   SpO2 (!) 67%   BMI 27.59 kg/m    Wt Readings from Last 3 Encounters:  10/30/23 146 lb (66.2 kg)  09/18/23 143 lb 9.6 oz (65.1 kg)  08/14/23 141 lb 12.8 oz (64.3 kg)     EXAM: General: Pt appears well and is in NAD  Eyes: External eye exam normal without stare, lid lag or exophthalmos.  EOM  intact.  PERRL.  Neck: General: Supple without adenopathy. Thyroid: Thyroid size normal.  No goiter or nodules appreciated.  Lungs: Clear with good BS bilat   Heart: Auscultation: RRR.  Abdomen: Soft, nontender  Extremities:  BL LE: No pretibial edema   Mental Status: Judgment, insight: Intact Orientation: Oriented to time, place, and person Mood and affect: No depression, anxiety, or agitation     DATA REVIEWED:  Latest Reference Range & Units 10/30/23 09:13  Calcium 8.6 - 10.4 mg/dL 96.0  Vitamin D, 45-[id number] 30 - 100 ng/mL 30    Latest Reference Range & Units 10/30/23 09:13  PTH, Intact 16 - 77 pg/mL 73  TSH 0.40 - 4.50 mIU/L 2.54  T4,Free(Direct) 0.8 - 1.8 ng/dL 0.9  Albumin MSPROF 3.6 - 5.1 g/dL 4.6     Latest Reference Range & Units 09/18/23 10:16  Sodium 135 - 145 mEq/L 139  Potassium 3.5 - 5.1 mEq/L 3.7  Chloride 96 - 112 mEq/L 103  CO2 19 - 32 mEq/L 28  Glucose 70 - 99 mg/dL 97  BUN 6 - 23 mg/dL 14  Creatinine 1.47 - 8.29 mg/dL 5.62  Calcium 8.4 - 13.0 mg/dL 86.5  Alkaline Phosphatase 39 - 117 U/L 96  Albumin 3.5 - 5.2 g/dL 4.5  AST 0 - 37 U/L 30  ALT 0 - 35 U/L 34  Total Protein 6.0 - 8.3 g/dL 7.4  Total Bilirubin 0.2 - 1.2 mg/dL 0.6  GFR >78.46 mL/min 96.69   Thyroid Ultrasound   Estimated total number of nodules >/= 1 cm: 1   Number of spongiform nodules >/=  2 cm not described below (TR1): 0   Number of mixed cystic and solid nodules >/= 1.5 cm not described below (TR2): 0   _________________________________________________________   Nodule # 1:   Prior biopsy: No   Location: Right; Mid   Maximum size: 3.6 cm; Other 2 dimensions: 2.7 x 2.3 cm, previously, 3.5 x 2.3 x 1.7 cm   Composition: mixed cystic and solid (1)   Echogenicity: isoechoic (1) (solid portion)   Shape: not taller-than-wide (0)   Margins: smooth (0)   Echogenic foci: none (0)   ACR TI-RADS total points: 2.   ACR TI-RADS risk category:  TR2 (2 points).    Significant change in size (>/= 20% in two dimensions and minimal increase of 2 mm): No   Change in features: No   Change in ACR TI-RADS risk category: No   ACR TI-RADS recommendations:  This nodule does NOT meet TI-RADS criteria for biopsy or dedicated follow-up.   _________________________________________________________   No cervical lymphadenopathy.   IMPRESSION: Slight interval enlargement of previously visualized mostly cystic right mid thyroid nodule which measures up to 3.6 cm (approximately 12 cc), previously 3.5 cm (approximately 7 cc). This nodule does not meet criteria for dedicated ultrasound follow-up or tissue sampling (TI-RADS category 2).   If symptomatic, thyroid cyst aspiration could be for performed by Interventional Radiology upon request.    DXA 02/14/2022  Site Region Measured Date Measured Age YA BMD Significant CHANGE T-score AP Spine L1-L4 02/14/2022 61.2 -3.5 0.761 g/cm2   DualFemur Neck Left 02/14/2022 61.2 -2.1 0.740 g/cm2   DualFemur Total Mean 02/14/2022 61.2 -1.2 0.851 g/cm2   Left Forearm Radius 33% 02/14/2022 61.2 -1.2 0.775 g/cm2    ASSESSMENT/PLAN/RECOMMENDATIONS:   Osteoporosis:  -Patient is at high risk for fracture -We discussed the importance of optimizing calcium, vitamin D, and weightbearing exercises -Serum calcium, GFR, PTH, vitamin D are normal -She was started on Boniva in 2023 through Dr. Willeen [facility]  -She will be due for repeat DXA scan in the summer of this year   2.  Thyromegaly:  -No local neck symptoms -The nodule did not meet FNA criteria -Patient is clinically and biochemically euthyroid  Follow-up in 6 months  Signed electronically by: Lyndle Herrlich, MD  [facility] Endocrinology  [facility] [address] [place] [place], [place] [address] Phone: [phone] FAX: [phone]   CC: Olive Bass, FNP [address] [place]  [address] Phone: [phone] Fax: [phone]   Return to Endocrinology clinic as below: Future Appointments  Date Time Provider Department Center  01/15/2024  9:40 AM Olive Bass, FNP [id number]  06/24/2024  9:00 AM [facility] DX DEXA 1 [facility]

## 2023-10-30 ENCOUNTER — Encounter: Payer: Self-pay | Admitting: Internal Medicine

## 2023-10-30 ENCOUNTER — Ambulatory Visit: Payer: 59 | Admitting: Internal Medicine

## 2023-10-30 VITALS — BP 118/74 | HR 98 | Ht 61.0 in | Wt 146.0 lb

## 2023-10-30 DIAGNOSIS — E041 Nontoxic single thyroid nodule: Secondary | ICD-10-CM | POA: Diagnosis not present

## 2023-10-30 DIAGNOSIS — M816 Localized osteoporosis [Lequesne]: Secondary | ICD-10-CM

## 2023-10-30 MED ORDER — IBANDRONATE SODIUM 150 MG PO TABS: 150.0000 mg | ORAL_TABLET | ORAL | 3 refills | Status: DC

## 2023-10-31 LAB — PTH, INTACT AND CALCIUM
Calcium: 10.3 mg/dL (ref 8.6–10.4)
PTH: 73 pg/mL (ref 16–77)

## 2023-10-31 LAB — T4, FREE: Free T4: 0.9 ng/dL (ref 0.8–1.8)

## 2023-10-31 LAB — ALBUMIN: Albumin: 4.6 g/dL (ref 3.6–5.1)

## 2023-10-31 LAB — VITAMIN D 25 HYDROXY (VIT D DEFICIENCY, FRACTURES): Vit D, 25-Hydroxy: 30 ng/mL (ref 30–100)

## 2023-10-31 LAB — TSH: TSH: 2.54 m[IU]/L (ref 0.40–4.50)

## 2023-11-02 ENCOUNTER — Encounter: Payer: Self-pay | Admitting: Internal Medicine

## 2024-01-10 ENCOUNTER — Other Ambulatory Visit: Payer: Self-pay | Admitting: Family

## 2024-01-10 DIAGNOSIS — N951 Menopausal and female climacteric states: Secondary | ICD-10-CM

## 2024-01-15 ENCOUNTER — Ambulatory Visit: Payer: 59 | Admitting: Family

## 2024-02-10 ENCOUNTER — Other Ambulatory Visit: Payer: Self-pay | Admitting: Family

## 2024-02-10 DIAGNOSIS — N951 Menopausal and female climacteric states: Secondary | ICD-10-CM

## 2024-03-04 ENCOUNTER — Other Ambulatory Visit: Payer: Self-pay | Admitting: Family

## 2024-03-04 DIAGNOSIS — N951 Menopausal and female climacteric states: Secondary | ICD-10-CM

## 2024-03-18 ENCOUNTER — Encounter: Payer: Self-pay | Admitting: Family

## 2024-03-18 ENCOUNTER — Ambulatory Visit (INDEPENDENT_AMBULATORY_CARE_PROVIDER_SITE_OTHER): Admitting: Family

## 2024-03-18 VITALS — BP 132/86 | HR 72 | Ht 61.0 in | Wt 140.2 lb

## 2024-03-18 DIAGNOSIS — M810 Age-related osteoporosis without current pathological fracture: Secondary | ICD-10-CM

## 2024-03-18 DIAGNOSIS — E785 Hyperlipidemia, unspecified: Secondary | ICD-10-CM

## 2024-03-18 DIAGNOSIS — R7303 Prediabetes: Secondary | ICD-10-CM | POA: Diagnosis not present

## 2024-03-18 DIAGNOSIS — I10 Essential (primary) hypertension: Secondary | ICD-10-CM | POA: Diagnosis not present

## 2024-03-18 LAB — COMPREHENSIVE METABOLIC PANEL WITH GFR
ALT: 20 U/L (ref 0–35)
AST: 24 U/L (ref 0–37)
Albumin: 4.2 g/dL (ref 3.5–5.2)
Alkaline Phosphatase: 81 U/L (ref 39–117)
BUN: 11 mg/dL (ref 6–23)
CO2: 32 meq/L (ref 19–32)
Calcium: 9.9 mg/dL (ref 8.4–10.5)
Chloride: 102 meq/L (ref 96–112)
Creatinine, Ser: 0.66 mg/dL (ref 0.40–1.20)
GFR: 93.4 mL/min (ref >60.00)
Glucose, Bld: 93 mg/dL (ref 70–99)
Potassium: 3.9 meq/L (ref 3.5–5.1)
Sodium: 139 meq/L (ref 135–145)
Total Bilirubin: 0.6 mg/dL (ref 0.2–1.2)
Total Protein: 7.4 g/dL (ref 6.0–8.3)

## 2024-03-18 LAB — LIPID PANEL
Cholesterol: 175 mg/dL (ref 0–200)
HDL: 41.9 mg/dL (ref >39.00)
LDL Cholesterol: 91 mg/dL (ref 0–99)
NonHDL: 133.19
Total CHOL/HDL Ratio: 4
Triglycerides: 211 mg/dL — ABNORMAL HIGH (ref 0.0–149.0)
VLDL: 42.2 mg/dL — ABNORMAL HIGH (ref 0.0–40.0)

## 2024-03-18 LAB — CBC WITH DIFFERENTIAL/PLATELET
Basophils Absolute: 0 K/uL (ref 0.0–0.1)
Basophils Relative: 0.8 % (ref 0.0–3.0)
Eosinophils Absolute: 0.1 K/uL (ref 0.0–0.7)
Eosinophils Relative: 2 % (ref 0.0–5.0)
HCT: 35.6 % — ABNORMAL LOW (ref 36.0–46.0)
Hemoglobin: 11.6 g/dL — ABNORMAL LOW (ref 12.0–15.0)
Lymphocytes Relative: 31.7 % (ref 12.0–46.0)
Lymphs Abs: 1.8 K/uL (ref 0.7–4.0)
MCHC: 32.7 g/dL (ref 30.0–36.0)
MCV: 84.4 fl (ref 78.0–100.0)
Monocytes Absolute: 0.5 K/uL (ref 0.1–1.0)
Monocytes Relative: 8.7 % (ref 3.0–12.0)
Neutro Abs: 3.2 K/uL (ref 1.4–7.7)
Neutrophils Relative %: 56.8 % (ref 43.0–77.0)
Platelets: 189 K/uL (ref 150.0–400.0)
RBC: 4.22 Mil/uL (ref 3.87–5.11)
RDW: 13.7 % (ref 11.5–15.5)
WBC: 5.7 K/uL (ref 4.0–10.5)

## 2024-03-18 LAB — HEMOGLOBIN A1C: Hgb A1c MFr Bld: 6.6 % — ABNORMAL HIGH (ref 4.6–6.5)

## 2024-03-18 NOTE — Progress Notes (Signed)
 Sandra Ball is a 63 y.o. female with the following history as recorded in Epic[employer]:  Patient Active Problem List   Diagnosis Date Noted   Localized osteoporosis without current pathological fracture 02/19/2022   Prediabetes 07/15/2021   Menopausal symptom 07/14/2020   Essential hypertension 04/20/2015   Hyperlipidemia with target LDL less than 130 06/26/2012    Current Outpatient Medications  Medication Sig Dispense Refill   atorvastatin  (LIPITOR) 40 MG tablet Take 1 tablet (40 mg total) by mouth daily. 90 tablet 3   calcium  carbonate (OSCAL) 1500 (600 Ca) MG TABS tablet Take by mouth 2 (two) times daily with a meal.     cetirizine  (ZYRTEC ) 10 MG tablet TAKE 1 TABLET BY MOUTH EVERY DAY 90 tablet 3   CINNAMON PO Take by mouth.     fluticasone  (FLONASE ) 50 MCG/ACT nasal spray Place 2 sprays into both nostrils daily. 16 g 6   ibandronate  (BONIVA ) 150 MG tablet Take 1 tablet (150 mg total) by mouth every 30 (thirty) days. 3 tablet 3   traZODone  (DESYREL ) 50 MG tablet Take 1 tablet (50 mg total) by mouth at bedtime as needed for sleep. 90 tablet 0   valsartan -hydrochlorothiazide  (DIOVAN -HCT) 160-12.5 MG tablet Take 1 tablet by mouth daily. 90 tablet 3   venlafaxine  XR (EFFEXOR -XR) 75 MG 24 hr capsule Take 1 capsule (75 mg total) by mouth daily with breakfast. 90 capsule 3   No current facility-administered medications for this visit.    Allergies: Aleve [naproxen sodium]  Past Medical History:  Diagnosis Date   Allergy    Hyperlipidemia    Hypertension    Ocular disease    Dr. Lillian   Osteoporosis    Sleep apnea    no CPAP    Past Surgical History:  Procedure Laterality Date   AUGMENTATION MAMMAPLASTY     BREAST SURGERY     COLONOSCOPY     TUBAL LIGATION      Family History  Problem Relation Age of Onset   Hypertension Mother    Cancer Mother        lung   Hypertension Father    Heart disease Father    Colon cancer Neg Hx    Esophageal cancer Neg Hx    Rectal  cancer Neg Hx    Stomach cancer Neg Hx     Social History   Tobacco Use   Smoking status: Never   Smokeless tobacco: Never  Substance Use Topics   Alcohol use: No    Subjective:   6 month follow up on chronic care needs; 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  Head: Normocephalic and atraumatic  Eyes: Sclera and conjunctiva clear; pupils round and reactive to light; extraocular movements intact  Ears: External normal; canals clear; tympanic membranes normal  Oropharynx: Pink, supple. No suspicious lesions  Neck: Supple without thyromegaly, adenopathy  Lungs: Respirations unlabored; clear to auscultation bilaterally without wheeze, rales, rhonchi  CVS exam: normal rate and regular rhythm.  Neurologic: Alert and oriented; speech  intact; face symmetrical; moves all extremities well; CNII-XII intact without focal deficit   Assessment:  1. Osteoporosis, unspecified osteoporosis type, unspecified pathological fracture presence   2. Pre-diabetes   3. Hyperlipidemia with target LDL less than 130   4. Essential hypertension     Plan:  Will update order for DEXA in preparation for upcoming visit with endocrinology; Check Hgba1c;  Check CBC, CMP, lipid panel today; Stable; continue same medication;   No follow-ups on file.  Orders Placed This Encounter  Procedures   DG Bone Density    Standing Status:   Future    Expiration Date:   03/18/2025    Reason for Exam (SYMPTOM  OR DIAGNOSIS REQUIRED):   osteoporosis    Preferred imaging location?:   [facility]   CBC with  Differential/Platelet   Comp Met (CMET)   Hemoglobin A1c   Lipid panel    Requested Prescriptions    No prescriptions requested or ordered in this encounter

## 2024-03-18 NOTE — Patient Instructions (Signed)
 If you change your mind, please let us  know that you want us  to set services for respite care and we will get that set up for you; you can also speak to your husband's hospice team about caregiver support services.

## 2024-03-21 ENCOUNTER — Ambulatory Visit: Payer: Self-pay | Admitting: Family

## 2024-04-11 ENCOUNTER — Other Ambulatory Visit (HOSPITAL_BASED_OUTPATIENT_CLINIC_OR_DEPARTMENT_OTHER)

## 2024-04-19 ENCOUNTER — Telehealth: Payer: Self-pay

## 2024-04-19 ENCOUNTER — Other Ambulatory Visit: Payer: Self-pay | Admitting: Family

## 2024-04-19 ENCOUNTER — Other Ambulatory Visit (HOSPITAL_BASED_OUTPATIENT_CLINIC_OR_DEPARTMENT_OTHER): Payer: Self-pay | Admitting: Family

## 2024-04-19 DIAGNOSIS — Z1231 Encounter for screening mammogram for malignant neoplasm of breast: Secondary | ICD-10-CM

## 2024-04-19 DIAGNOSIS — D509 Iron deficiency anemia, unspecified: Secondary | ICD-10-CM

## 2024-04-19 NOTE — Telephone Encounter (Signed)
 Called pts daughter back and scheduled a follow up lab appointment 04/29/2024.

## 2024-04-19 NOTE — Telephone Encounter (Signed)
 Copied from CRM [id number]. Topic: Clinical - Request for Lab/Test Order >> Apr 19, 2024 10:35 AM Lavanda D wrote: Reason for CRM: Patient's daughter is looking to scheduler her mother for the repeat labs for CBC with diff. She would like it around the 29th if possible - No order as of yet.  Please call her daughter to schedule as well if possible.   ----------------------------------------------------------------------- From previous Reason for Contact - Scheduling: Patient/patient representative is calling to schedule an appointment. Refer to attachments for appointment information.

## 2024-04-29 ENCOUNTER — Ambulatory Visit: Payer: Self-pay | Admitting: Family

## 2024-04-29 ENCOUNTER — Other Ambulatory Visit (INDEPENDENT_AMBULATORY_CARE_PROVIDER_SITE_OTHER)

## 2024-04-29 DIAGNOSIS — D509 Iron deficiency anemia, unspecified: Secondary | ICD-10-CM

## 2024-04-29 LAB — CBC WITH DIFFERENTIAL/PLATELET
Basophils Absolute: 0 K/uL (ref 0.0–0.1)
Basophils Relative: 0.6 % (ref 0.0–3.0)
Eosinophils Absolute: 0.2 K/uL (ref 0.0–0.7)
Eosinophils Relative: 4 % (ref 0.0–5.0)
HCT: 35.8 % — ABNORMAL LOW (ref 36.0–46.0)
Hemoglobin: 11.6 g/dL — ABNORMAL LOW (ref 12.0–15.0)
Lymphocytes Relative: 29.4 % (ref 12.0–46.0)
Lymphs Abs: 1.8 K/uL (ref 0.7–4.0)
MCHC: 32.4 g/dL (ref 30.0–36.0)
MCV: 85.1 fl (ref 78.0–100.0)
Monocytes Absolute: 0.6 K/uL (ref 0.1–1.0)
Monocytes Relative: 9.6 % (ref 3.0–12.0)
Neutro Abs: 3.4 K/uL (ref 1.4–7.7)
Neutrophils Relative %: 56.4 % (ref 43.0–77.0)
Platelets: 213 K/uL (ref 150.0–400.0)
RBC: 4.21 Mil/uL (ref 3.87–5.11)
RDW: 14.2 % (ref 11.5–15.5)
WBC: 6 K/uL (ref 4.0–10.5)

## 2024-04-29 LAB — IBC + FERRITIN
Ferritin: 96.8 ng/mL (ref 10.0–291.0)
Iron: 77 ug/dL (ref 42–145)
Saturation Ratios: 22.6 % (ref 20.0–50.0)
TIBC: 340.2 ug/dL (ref 250.0–450.0)
Transferrin: 243 mg/dL (ref 212.0–360.0)

## 2024-05-06 ENCOUNTER — Ambulatory Visit: Payer: 59 | Admitting: Internal Medicine

## 2024-05-17 ENCOUNTER — Encounter: Payer: Self-pay | Admitting: Family

## 2024-05-17 ENCOUNTER — Encounter: Payer: Self-pay | Admitting: Internal Medicine

## 2024-05-17 DIAGNOSIS — E041 Nontoxic single thyroid nodule: Secondary | ICD-10-CM

## 2024-05-18 ENCOUNTER — Encounter: Payer: Self-pay | Admitting: Internal Medicine

## 2024-05-19 ENCOUNTER — Inpatient Hospital Stay
Admission: RE | Admit: 2024-05-19 | Discharge: 2024-05-19 | Discharge status: Home / Self Care | Source: Ambulatory Visit | Attending: Internal Medicine | Admitting: Internal Medicine

## 2024-05-19 DIAGNOSIS — E041 Nontoxic single thyroid nodule: Secondary | ICD-10-CM | POA: Diagnosis not present

## 2024-05-26 IMAGING — MG DIGITAL SCREENING BREAST BILAT IMPLANT W/ TOMO W/ CAD
9 of 12 series · 9 of 28 positions shown · non-contrast
Comparison: Previous exam(s).

CLINICAL DATA: Screening.

EXAM:
DIGITAL SCREENING BILATERAL MAMMOGRAM WITH IMPLANTS, CAD AND
TOMOSYNTHESIS
TECHNIQUE: Bilateral screening digital craniocaudal and mediolateral oblique
mammograms were obtained. Bilateral screening digital breast
tomosynthesis was performed. The images were evaluated with
computer-aided detection. Standard and/or implant displaced views
were performed.

[L MLO]
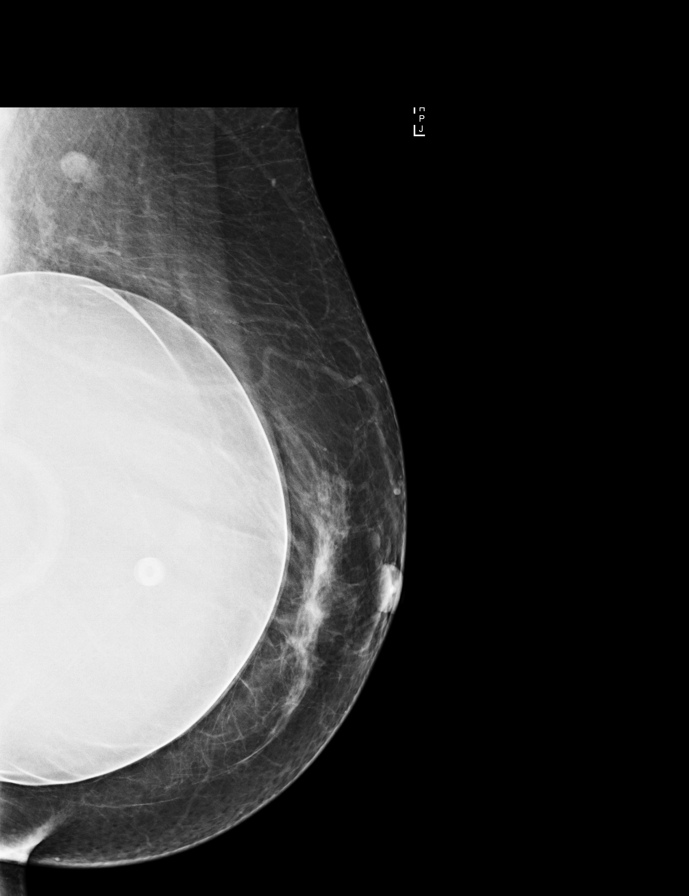

[L CC]
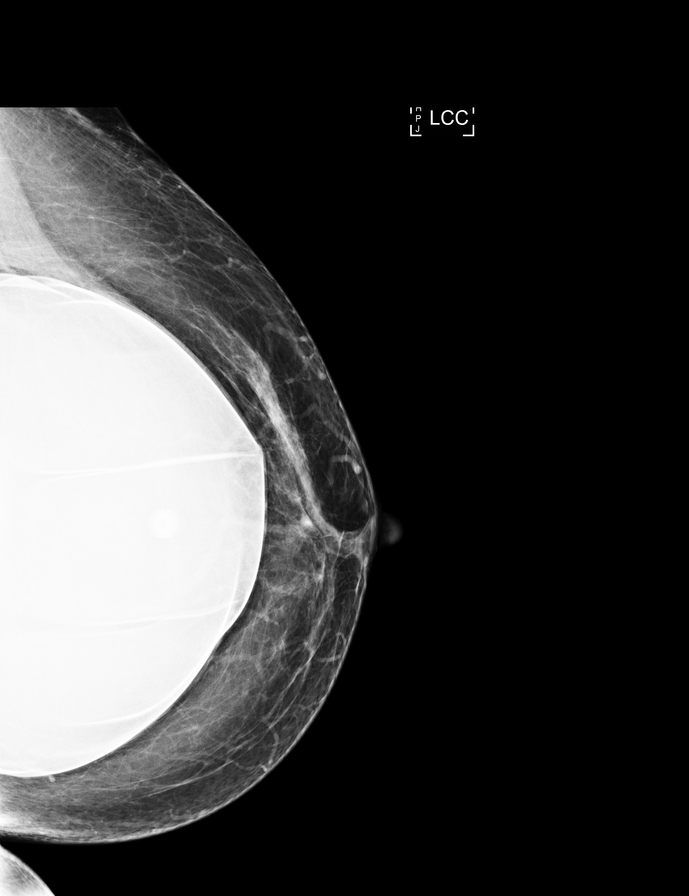

[R CC]
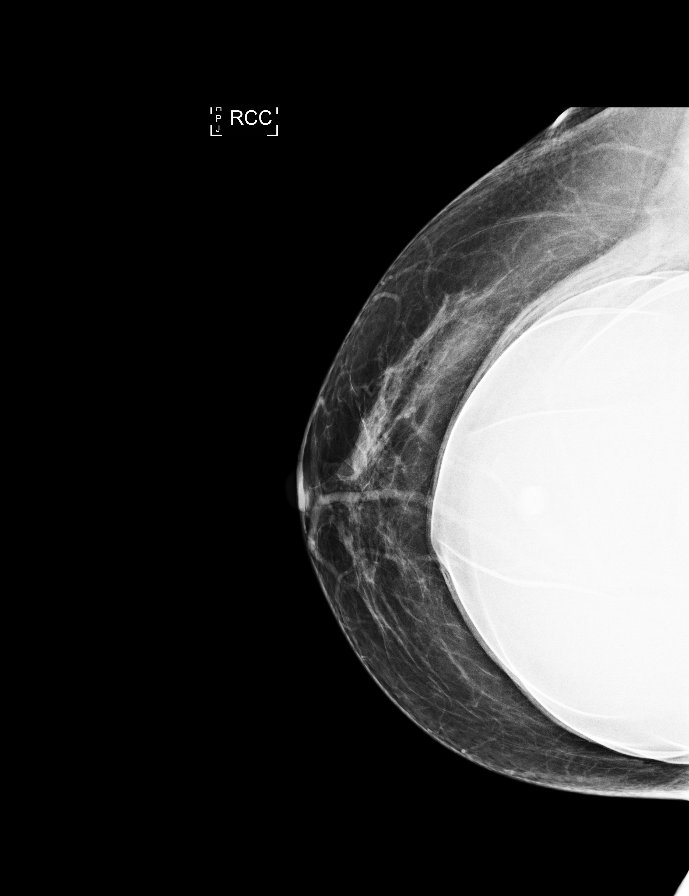

[R MLO]
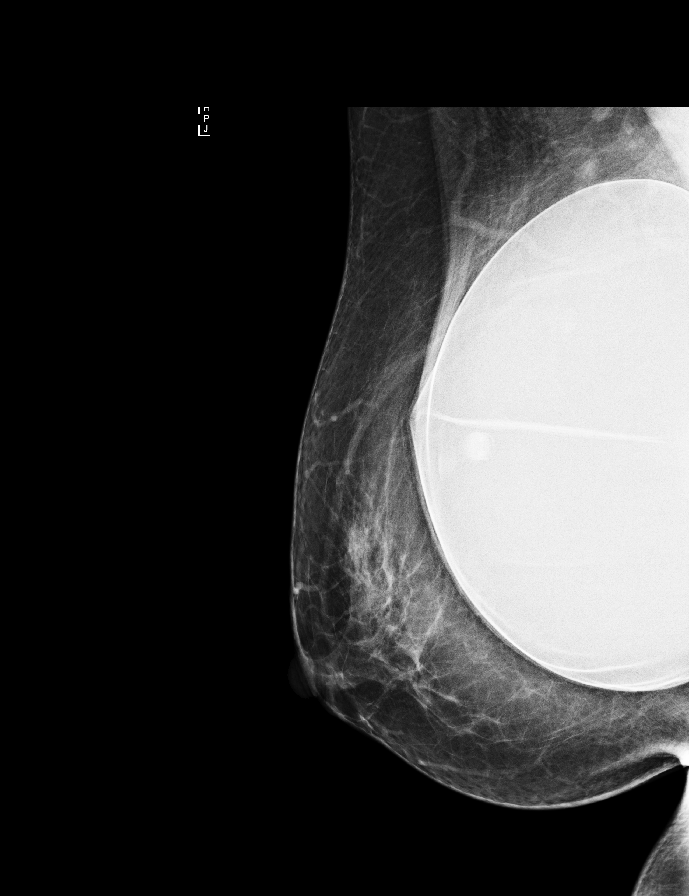

[L MLO synth-2D]
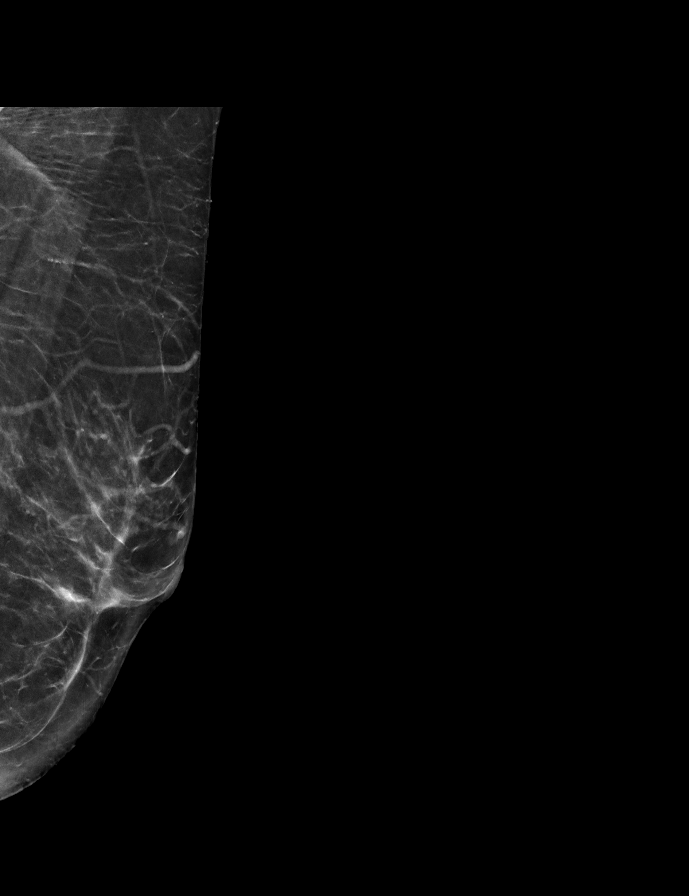

[L CC synth-2D]
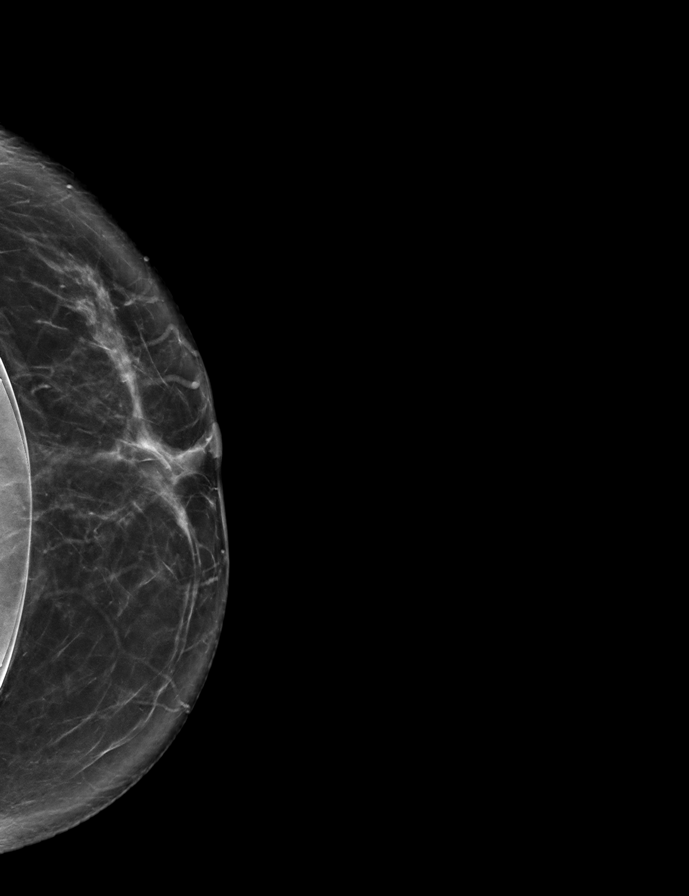

[R MLO synth-2D]
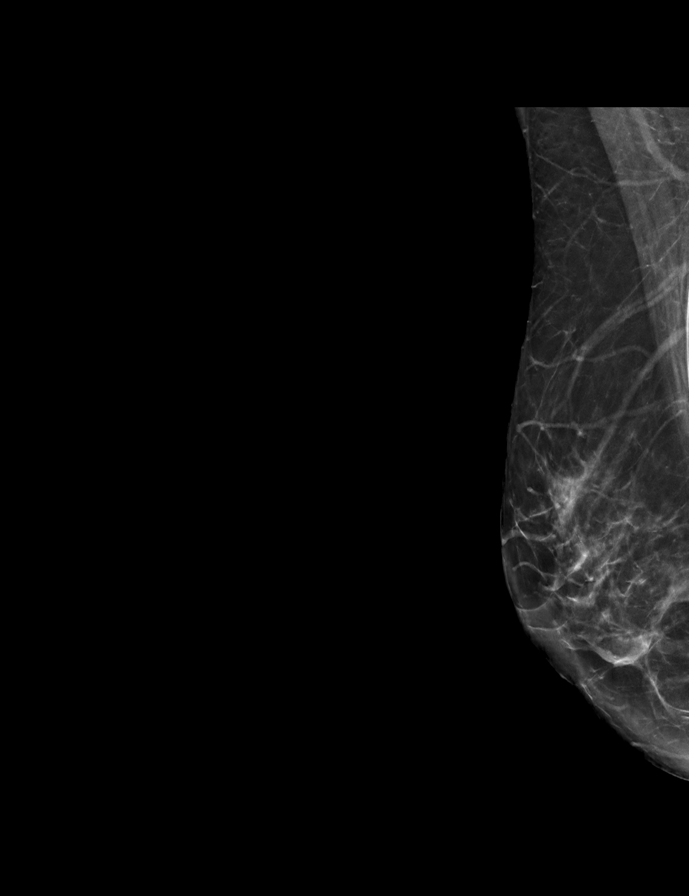

[R CC synth-2D]
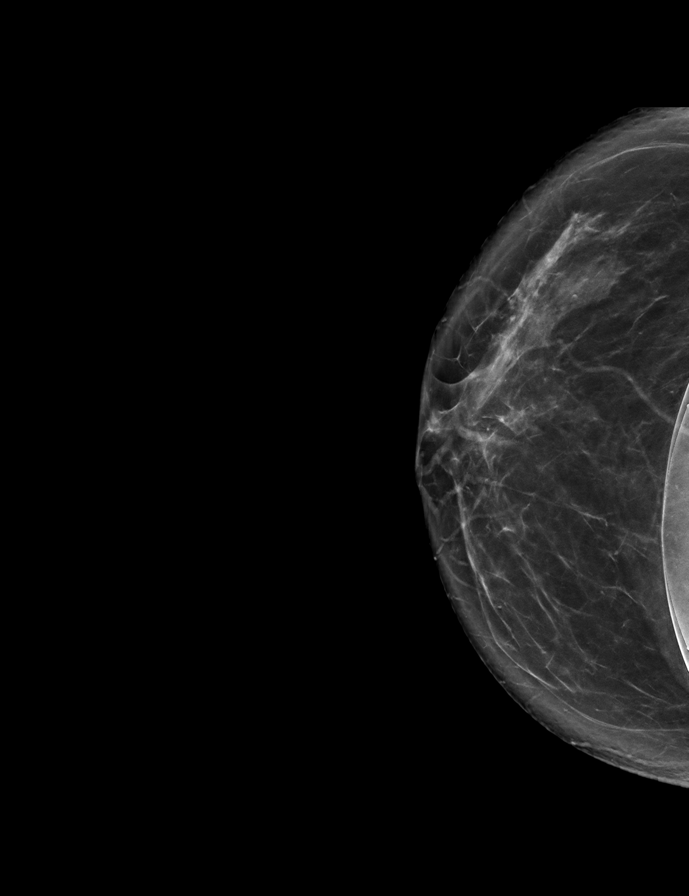

[R MLOID BREAST TOMOSYNTHESIS IMAGE tomo · tomo slice 36/71.0]
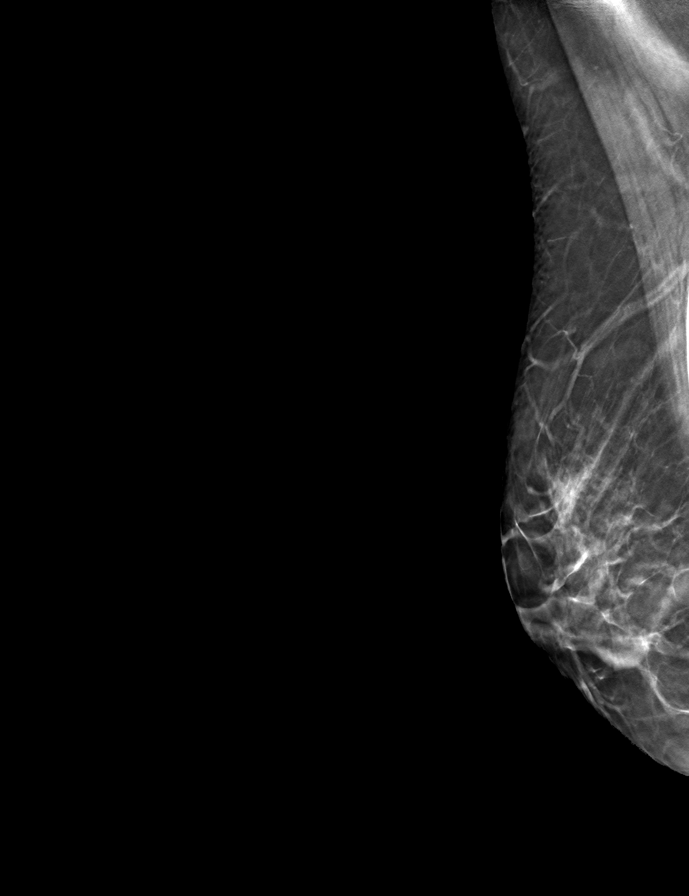

[9 of 28 positions shown; findings below may reference images not displayed]

ACR Breast Density Category b: There are scattered areas of
fibroglandular density.
FINDINGS: The patient has retropectoral implants. There are no findings
suspicious for malignancy.
IMPRESSION: No mammographic evidence of malignancy. A result letter of this
screening mammogram will be mailed directly to the patient.

RECOMMENDATION:
Screening mammogram in one year. (Code:SE-S-JMG)

BI-RADS CATEGORY  1:  Negative.

## 2024-05-27 ENCOUNTER — Ambulatory Visit: Payer: Self-pay | Admitting: Internal Medicine

## 2024-05-27 DIAGNOSIS — E041 Nontoxic single thyroid nodule: Secondary | ICD-10-CM

## 2024-05-30 ENCOUNTER — Ambulatory Visit (HOSPITAL_BASED_OUTPATIENT_CLINIC_OR_DEPARTMENT_OTHER)
Admission: RE | Admit: 2024-05-30 | Discharge: 2024-05-30 | Disposition: A | Discharge status: Home / Self Care | Source: Ambulatory Visit | Attending: Family | Admitting: Family

## 2024-05-30 ENCOUNTER — Encounter (HOSPITAL_BASED_OUTPATIENT_CLINIC_OR_DEPARTMENT_OTHER): Payer: Self-pay

## 2024-05-30 DIAGNOSIS — Z1231 Encounter for screening mammogram for malignant neoplasm of breast: Secondary | ICD-10-CM

## 2024-05-30 DIAGNOSIS — Z780 Asymptomatic menopausal state: Secondary | ICD-10-CM | POA: Diagnosis not present

## 2024-05-30 DIAGNOSIS — M810 Age-related osteoporosis without current pathological fracture: Secondary | ICD-10-CM | POA: Insufficient documentation

## 2024-05-31 NOTE — Telephone Encounter (Signed)
 An order to aspirate the thyroid  cyst has been placed.

## 2024-06-10 ENCOUNTER — Other Ambulatory Visit

## 2024-06-10 ENCOUNTER — Encounter: Payer: Self-pay | Admitting: Internal Medicine

## 2024-06-10 ENCOUNTER — Ambulatory Visit: Admitting: Internal Medicine

## 2024-06-10 VITALS — BP 120/80 | HR 64 | Ht 61.0 in | Wt 139.8 lb

## 2024-06-10 DIAGNOSIS — M816 Localized osteoporosis [Lequesne]: Secondary | ICD-10-CM

## 2024-06-10 DIAGNOSIS — E041 Nontoxic single thyroid nodule: Secondary | ICD-10-CM | POA: Diagnosis not present

## 2024-06-10 MED ORDER — IBANDRONATE SODIUM 150 MG PO TABS: 150.0000 mg | ORAL_TABLET | ORAL | 3 refills | Status: DC

## 2024-06-10 NOTE — Progress Notes (Signed)
 Name: Sandra Ball  MRN/ DOB: [id number], 09-09-1960    Age/ Sex: 63 y.o., female    PCP: Jason Leita Repine, FNP (Inactive)   Reason for Endocrinology Evaluation: Osteoporosis     Date of Initial Endocrinology Evaluation: 10/30/2023    HPI: Sandra Ball is a 63 y.o. female with a past medical history of Dyslipidemia,Osteoporosis and HTN . The patient presented for initial endocrinology clinic visit on 10/30/2023  for consultative assistance with her osteoporosis.   Pt was diagnosed with osteoporosis: in 2023 with a T-Score -3.5 at the AP spine   Menarche at age : 24 Menopausal at age : 3 Fracture Hx: no  Hx of HRT: no  FH of osteoporosis or hip fracture: no  Prior Hx of anti-resorptive therapy : Boniva  04/2022  Transferred care from Dr. Tommas  THYROMEGALY:  She has been diagnosed with cystic right thyroid  nodule on ultrasound from 2022 at 3.5 cm, repeat ultrasound 2023 did not show any significant change at 3.6 cm.  The nodule did not meet any criteria for follow-up or FNA   The patient was scheduled for an aspirate of 4.4 cm right thyroid  nodule due to symptoms October, 2025    SUBJECTIVE:    Today (06/10/24): Sandra Ball is here for follow-up on osteoporosis and multinodular goiter.   She is accompanied by her daughter Candyce today  Since her last visit here she contacted our office in September, 2025 with soreness around her thyroid  area as well as increase in size.  Thyroid  ultrasound did reveal a 4.4 cm right thyroid  nodule that is mildly increased in size, due to symptom, patient opted for aspiration.  She is scheduled for this on 06/15/2024  Dysphagia and neck swelling is improving  No heartburn  No constipation or diarrhea  NO falls   Boniva  150 mg once monthly Calcium  200 mg/Vit D ,2 tabs daily    HISTORY:  Past Medical History:  Past Medical History:  Diagnosis Date   Allergy    Hyperlipidemia    Hypertension    Ocular disease    Dr.  Lillian   Osteoporosis    Sleep apnea    no CPAP   Past Surgical History:  Past Surgical History:  Procedure Laterality Date   AUGMENTATION MAMMAPLASTY     BREAST SURGERY     COLONOSCOPY     TUBAL LIGATION      Social History:  reports that she has never smoked. She has never used smokeless tobacco. She reports that she does not drink alcohol and does not use drugs. Family History: family history includes Cancer in her mother; Heart disease in her father; Hypertension in her father and mother.   HOME MEDICATIONS: Allergies as of 06/10/2024       Reactions   Aleve [naproxen Sodium]    Affected kidneys        Medication List        Accurate as of June 10, 2024  8:56 AM. If you have any questions, ask your nurse or doctor.          atorvastatin  40 MG tablet Commonly known as: LIPITOR Take 1 tablet (40 mg total) by mouth daily.   calcium  carbonate 1500 (600 Ca) MG Tabs tablet Commonly known as: OSCAL Take by mouth 2 (two) times daily with a meal.   cetirizine  10 MG tablet Commonly known as: ZYRTEC  TAKE 1 TABLET BY MOUTH EVERY DAY   CINNAMON PO Take by mouth.  fluticasone  50 MCG/ACT nasal spray Commonly known as: FLONASE  Place 2 sprays into both nostrils daily.   ibandronate  150 MG tablet Commonly known as: BONIVA  Take 1 tablet (150 mg total) by mouth every 30 (thirty) days.   traZODone  50 MG tablet Commonly known as: DESYREL  Take 1 tablet (50 mg total) by mouth at bedtime as needed for sleep.   valsartan -hydrochlorothiazide  160-12.5 MG tablet Commonly known as: DIOVAN -HCT Take 1 tablet by mouth daily.   venlafaxine  XR 75 MG 24 hr capsule Commonly known as: EFFEXOR -XR Take 1 capsule (75 mg total) by mouth daily with breakfast.          REVIEW OF SYSTEMS: A comprehensive ROS was conducted with the patient and is negative except as per HPI    OBJECTIVE:  VS: BP 120/80 (BP Location: Left Arm, Patient Position: Sitting, Cuff Size: Large)    Pulse 64   Ht 5' 1 (1.549 m)   Wt 139 lb 12.8 oz (63.4 kg)   SpO2 99%   BMI 26.41 kg/m    Wt Readings from Last 3 Encounters:  06/10/24 139 lb 12.8 oz (63.4 kg)  03/18/24 140 lb 3.2 oz (63.6 kg)  10/30/23 146 lb (66.2 kg)     EXAM: General: Pt appears well and is in NAD  Thyroid : Right thyroid  nodule appreciated  Lungs: Clear with good BS bilat   Heart: Auscultation: RRR.  Abdomen: Soft, nontender  Extremities:  BL LE: No pretibial edema   Mental Status: Judgment, insight: Intact Orientation: Oriented to time, place, and person Mood and affect: No depression, anxiety, or agitation      DATA REVIEWED:  Latest Reference Range & Units 06/10/24 09:16  Calcium  8.6 - 10.4 mg/dL 89.2 (H)    Latest Reference Range & Units 06/10/24 09:16  Vitamin D , 25-Hydroxy 30 - 100 ng/mL 44    Latest Reference Range & Units 06/10/24 09:16  PTH, Intact 16 - 77 pg/mL 48  TSH 0.40 - 4.50 mIU/L 3.18  T4,Free(Direct) 0.8 - 1.8 ng/dL 1.0  Albumin MSPROF 3.6 - 5.1 g/dL 4.4       Thyroid  Ultrasound 05/19/2024  COMPARISON:  05/30/2022   FINDINGS: Parenchymal Echotexture: Mildly heterogenous   Isthmus: 0.2 cm   Right lobe: 6.6 x 3.3 x 4.0 cm   Left lobe: 5.0 x 1.1 x 1.2 cm   _________________________________________________________   Estimated total number of nodules >/= 1 cm: 1   Number of spongiform nodules >/=  2 cm not described below (TR1): 0   Number of mixed cystic and solid nodules >/= 1.5 cm not described below (TR2): 0   _________________________________________________________   Nodule 1: 4.4 x 3.8 x 3.0 cm mixed solid cystic isoechoic RIGHT inferior thyroid  nodule (TI-RADS 2) has mildly increased in size since the prior ultrasound from 05/30/2022 when it measured 3.6 x 2.7 x 2.3 cm. Benign calcifications with comet tail artifact are seen within the cyst.   No new thyroid  nodules are seen.   IMPRESSION: 4.4 cm, TI-RADS 2, RIGHT thyroid  nodule has mildly  increased in size since prior ultrasound. Although it does not meet criteria for FNA, it is amenable to ultrasound-guided aspiration if needed for symptomatic relief.     DXA 05/30/2024 COMPARISON:  Current and prior studies are not comparable because of dissimilar scan types or analysis methods.   FINDINGS: Scan quality: Good.   LUMBAR SPINE (L1-L4):   BMD (in g/cm2): 0.805   T-score: -3.1   Z-score: -1.7   LEFT FEMORAL NECK:  BMD (in g/cm2): 0.829   T-score: -1.5   Z-score: -0.1   LEFT TOTAL HIP:   BMD (in g/cm2): 0.873   T-score: -1.1   Z-score: 0.0   RIGHT FEMORAL NECK:   BMD (in g/cm2): 0.843   T-score: -1.4   Z-score: 0.0   RIGHT TOTAL HIP:   BMD (in g/cm2): 0.879   T-score: -1.0   Z-score: 0.1     IMPRESSION: Osteoporosis based on BMD.       ASSESSMENT/PLAN/RECOMMENDATIONS:   Osteoporosis:  -Patient is at high risk for fracture - We again emphasized the importance of optimizing calcium , vitamin D  intake, as the patient has been taking suboptimal amounts of calcium , we again encouraged the importance of weightbearing exercises - Up-to-date on DXA scan - No changes at this time  Medication Continue Boniva  150 mg once a month  2.  Right thyroid  nodule:  - Patient with local neck symptoms -Patient is clinically euthyroid - She is scheduled for nodule aspiration on 06/15/2024   3. Hypercalcemia :  - This is new  - Corrected serum calcium  10.38 mg/DL, which is within normal range but at the upper limit of normal - Vitamin D  is within normal range  Follow-up in 6 months  Signed electronically by: Stefano Redgie Butts, MD  [facility] Endocrinology  [facility] [address] [place], [place] [address] Phone: [phone] FAX: [phone]   CC: Jason Leita Repine, FNP (Inactive) No address on file Phone: None Fax: None   Return to Endocrinology clinic as below: Future Appointments  Date  Time Provider Department Center  06/15/2024  4:00 PM [facility]  2 [facility]

## 2024-06-10 NOTE — Patient Instructions (Signed)
 Calcium  600 mg twice daily Please consider weightbearing exercises 2-3 times a week

## 2024-06-11 LAB — VITAMIN D 25 HYDROXY (VIT D DEFICIENCY, FRACTURES): Vit D, 25-Hydroxy: 44 ng/mL (ref 30–100)

## 2024-06-11 LAB — T4, FREE: Free T4: 1 ng/dL (ref 0.8–1.8)

## 2024-06-11 LAB — PTH, INTACT AND CALCIUM
Calcium: 10.7 mg/dL — ABNORMAL HIGH (ref 8.6–10.4)
PTH: 48 pg/mL (ref 16–77)

## 2024-06-11 LAB — TSH: TSH: 3.18 m[IU]/L (ref 0.40–4.50)

## 2024-06-11 LAB — ALBUMIN: Albumin: 4.4 g/dL (ref 3.6–5.1)

## 2024-06-13 ENCOUNTER — Ambulatory Visit: Payer: Self-pay | Admitting: Internal Medicine

## 2024-06-15 ENCOUNTER — Other Ambulatory Visit (HOSPITAL_COMMUNITY)
Admission: RE | Admit: 2024-06-15 | Discharge: 2024-06-15 | Disposition: A | Discharge status: Home / Self Care | Source: Ambulatory Visit | Attending: Internal Medicine | Admitting: Internal Medicine

## 2024-06-15 ENCOUNTER — Ambulatory Visit
Admission: RE | Admit: 2024-06-15 | Discharge: 2024-06-15 | Disposition: A | Discharge status: Home / Self Care | Source: Ambulatory Visit | Attending: Internal Medicine | Admitting: Internal Medicine

## 2024-06-15 DIAGNOSIS — E041 Nontoxic single thyroid nodule: Secondary | ICD-10-CM | POA: Insufficient documentation

## 2024-06-17 LAB — CYTOLOGY - NON PAP

## 2024-06-21 ENCOUNTER — Ambulatory Visit: Payer: Self-pay | Admitting: Internal Medicine

## 2024-06-24 ENCOUNTER — Other Ambulatory Visit: Payer: 59

## 2024-08-21 ENCOUNTER — Encounter: Payer: Self-pay | Admitting: Internal Medicine

## 2024-09-27 ENCOUNTER — Encounter: Payer: Self-pay | Admitting: Internal Medicine

## 2024-09-27 MED ORDER — IBANDRONATE SODIUM 150 MG PO TABS: 150.0000 mg | ORAL_TABLET | ORAL | 3 refills | Status: AC

## 2024-10-11 ENCOUNTER — Other Ambulatory Visit: Payer: Self-pay

## 2024-10-11 MED ORDER — VALSARTAN-HYDROCHLOROTHIAZIDE 160-12.5 MG PO TABS: 1.0000 | ORAL_TABLET | Freq: Every day | ORAL | 3 refills | Status: AC

## 2024-10-11 MED ORDER — VENLAFAXINE HCL ER 75 MG PO CP24: 75.0000 mg | ORAL_CAPSULE | Freq: Every day | ORAL | 3 refills | Status: AC

## 2024-11-25 ENCOUNTER — Encounter: Admitting: Student

## 2025-06-16 ENCOUNTER — Ambulatory Visit: Admitting: Internal Medicine
